# Patient Record
Sex: Male | Born: 1960 | Race: White | Hispanic: No | Smoking: Former smoker
Health system: Southern US, Community
[De-identification: ages and names within clinical notes are randomized; demographics above are authoritative.]

## PROBLEM LIST (undated history)

## (undated) ENCOUNTER — Emergency Department (HOSPITAL_COMMUNITY): Payer: Medicare HMO | Source: Home / Self Care

## (undated) DIAGNOSIS — G4700 Insomnia, unspecified: Secondary | ICD-10-CM

## (undated) DIAGNOSIS — J449 Chronic obstructive pulmonary disease, unspecified: Secondary | ICD-10-CM

## (undated) DIAGNOSIS — G629 Polyneuropathy, unspecified: Secondary | ICD-10-CM

## (undated) DIAGNOSIS — E785 Hyperlipidemia, unspecified: Secondary | ICD-10-CM

## (undated) DIAGNOSIS — F411 Generalized anxiety disorder: Secondary | ICD-10-CM

## (undated) HISTORY — DX: Hyperlipidemia, unspecified: E78.5

## (undated) HISTORY — DX: Chronic obstructive pulmonary disease, unspecified: J44.9

## (undated) HISTORY — DX: Polyneuropathy, unspecified: G62.9

## (undated) HISTORY — DX: Insomnia, unspecified: G47.00

## (undated) HISTORY — DX: Generalized anxiety disorder: F41.1

---

## 1994-07-31 HISTORY — PX: JOINT REPLACEMENT: SHX530

## 2015-05-21 DIAGNOSIS — H538 Other visual disturbances: Secondary | ICD-10-CM | POA: Diagnosis not present

## 2015-05-21 DIAGNOSIS — M79604 Pain in right leg: Secondary | ICD-10-CM | POA: Diagnosis not present

## 2015-05-21 DIAGNOSIS — G629 Polyneuropathy, unspecified: Secondary | ICD-10-CM | POA: Diagnosis not present

## 2015-05-21 DIAGNOSIS — M79605 Pain in left leg: Secondary | ICD-10-CM | POA: Diagnosis not present

## 2015-05-26 DIAGNOSIS — R209 Unspecified disturbances of skin sensation: Secondary | ICD-10-CM | POA: Diagnosis not present

## 2015-05-26 DIAGNOSIS — I739 Peripheral vascular disease, unspecified: Secondary | ICD-10-CM | POA: Diagnosis not present

## 2015-05-26 DIAGNOSIS — R938 Abnormal findings on diagnostic imaging of other specified body structures: Secondary | ICD-10-CM | POA: Diagnosis not present

## 2015-05-26 DIAGNOSIS — I70213 Atherosclerosis of native arteries of extremities with intermittent claudication, bilateral legs: Secondary | ICD-10-CM | POA: Diagnosis not present

## 2015-06-21 DIAGNOSIS — E782 Mixed hyperlipidemia: Secondary | ICD-10-CM | POA: Diagnosis not present

## 2015-06-21 DIAGNOSIS — M79605 Pain in left leg: Secondary | ICD-10-CM | POA: Diagnosis not present

## 2015-06-21 DIAGNOSIS — G629 Polyneuropathy, unspecified: Secondary | ICD-10-CM | POA: Diagnosis not present

## 2015-06-21 DIAGNOSIS — H538 Other visual disturbances: Secondary | ICD-10-CM | POA: Diagnosis not present

## 2015-06-21 DIAGNOSIS — G4709 Other insomnia: Secondary | ICD-10-CM | POA: Diagnosis not present

## 2015-06-21 DIAGNOSIS — R69 Illness, unspecified: Secondary | ICD-10-CM | POA: Diagnosis not present

## 2015-06-21 DIAGNOSIS — M79604 Pain in right leg: Secondary | ICD-10-CM | POA: Diagnosis not present

## 2015-06-23 ENCOUNTER — Encounter: Payer: Self-pay | Admitting: Vascular Surgery

## 2015-07-08 ENCOUNTER — Encounter: Payer: Self-pay | Admitting: Vascular Surgery

## 2015-07-14 ENCOUNTER — Encounter: Payer: Medicare HMO | Admitting: Vascular Surgery

## 2015-07-29 ENCOUNTER — Encounter: Payer: Self-pay | Admitting: Vascular Surgery

## 2015-08-06 ENCOUNTER — Encounter: Payer: Self-pay | Admitting: Vascular Surgery

## 2015-08-06 ENCOUNTER — Encounter: Payer: Medicare HMO | Admitting: Vascular Surgery

## 2015-08-13 ENCOUNTER — Encounter: Payer: Medicare HMO | Admitting: Vascular Surgery

## 2015-11-19 ENCOUNTER — Emergency Department (HOSPITAL_COMMUNITY)
Admission: EM | Admit: 2015-11-19 | Discharge: 2015-11-19 | Disposition: A | Discharge status: Home / Self Care | Payer: Medicare HMO | Attending: Emergency Medicine | Admitting: Emergency Medicine

## 2015-11-19 ENCOUNTER — Encounter (HOSPITAL_COMMUNITY): Payer: Self-pay | Admitting: Family Medicine

## 2015-11-19 ENCOUNTER — Emergency Department (HOSPITAL_COMMUNITY): Payer: Medicare HMO

## 2015-11-19 DIAGNOSIS — R69 Illness, unspecified: Secondary | ICD-10-CM | POA: Diagnosis not present

## 2015-11-19 DIAGNOSIS — Z8669 Personal history of other diseases of the nervous system and sense organs: Secondary | ICD-10-CM | POA: Insufficient documentation

## 2015-11-19 DIAGNOSIS — R062 Wheezing: Secondary | ICD-10-CM | POA: Insufficient documentation

## 2015-11-19 DIAGNOSIS — F17200 Nicotine dependence, unspecified, uncomplicated: Secondary | ICD-10-CM | POA: Diagnosis not present

## 2015-11-19 DIAGNOSIS — R0602 Shortness of breath: Secondary | ICD-10-CM | POA: Diagnosis not present

## 2015-11-19 DIAGNOSIS — R079 Chest pain, unspecified: Secondary | ICD-10-CM | POA: Diagnosis not present

## 2015-11-19 DIAGNOSIS — F411 Generalized anxiety disorder: Secondary | ICD-10-CM | POA: Diagnosis not present

## 2015-11-19 DIAGNOSIS — Z79899 Other long term (current) drug therapy: Secondary | ICD-10-CM | POA: Diagnosis not present

## 2015-11-19 DIAGNOSIS — R059 Cough, unspecified: Secondary | ICD-10-CM

## 2015-11-19 DIAGNOSIS — R05 Cough: Secondary | ICD-10-CM | POA: Diagnosis not present

## 2015-11-19 LAB — CBC
HCT: 38.7 % — ABNORMAL LOW (ref 39.0–52.0)
HEMOGLOBIN: 13.3 g/dL (ref 13.0–17.0)
MCH: 29.6 pg (ref 26.0–34.0)
MCHC: 34.4 g/dL (ref 30.0–36.0)
MCV: 86 fL (ref 78.0–100.0)
PLATELETS: 403 10*3/uL — AB (ref 150–400)
RBC: 4.5 MIL/uL (ref 4.22–5.81)
RDW: 13.3 % (ref 11.5–15.5)
WBC: 17.2 10*3/uL — ABNORMAL HIGH (ref 4.0–10.5)

## 2015-11-19 LAB — BASIC METABOLIC PANEL
ANION GAP: 13 (ref 5–15)
BUN: 15 mg/dL (ref 6–20)
CALCIUM: 8.6 mg/dL — AB (ref 8.9–10.3)
CO2: 22 mmol/L (ref 22–32)
Chloride: 99 mmol/L — ABNORMAL LOW (ref 101–111)
Creatinine, Ser: 1.22 mg/dL (ref 0.61–1.24)
GFR calc Af Amer: >60 mL/min (ref >60)
GLUCOSE: 110 mg/dL — AB (ref 65–99)
Potassium: 3.4 mmol/L — ABNORMAL LOW (ref 3.5–5.1)
Sodium: 134 mmol/L — ABNORMAL LOW (ref 135–145)

## 2015-11-19 LAB — I-STAT TROPONIN, ED: TROPONIN I, POC: 0 ng/mL (ref 0.00–0.08)

## 2015-11-19 LAB — D-DIMER, QUANTITATIVE: D-Dimer, Quant: 0.58 ug/mL-FEU — ABNORMAL HIGH (ref 0.00–0.50)

## 2015-11-19 MED ORDER — HYDROCODONE-HOMATROPINE 5-1.5 MG/5ML PO SYRP
5.0000 mL | ORAL_SOLUTION | Freq: Once | ORAL | Status: AC
Start: 2015-11-19 — End: 2015-11-19
  Administered 2015-11-19: 5 mL via ORAL
  Filled 2015-11-19: qty 5

## 2015-11-19 MED ORDER — IPRATROPIUM-ALBUTEROL 0.5-2.5 (3) MG/3ML IN SOLN
3.0000 mL | Freq: Once | RESPIRATORY_TRACT | Status: AC
  Administered 2015-11-19: 3 mL via RESPIRATORY_TRACT
  Filled 2015-11-19: qty 3

## 2015-11-19 MED ORDER — ALBUTEROL SULFATE HFA 108 (90 BASE) MCG/ACT IN AERS
2.0000 | INHALATION_SPRAY | RESPIRATORY_TRACT | Status: DC | PRN
  Administered 2015-11-19: 2 via RESPIRATORY_TRACT
  Filled 2015-11-19: qty 6.7

## 2015-11-19 MED ORDER — IOPAMIDOL (ISOVUE-370) INJECTION 76%
INTRAVENOUS | Status: AC
  Administered 2015-11-19: 100 mL
  Filled 2015-11-19: qty 100

## 2015-11-19 MED ORDER — HYDROCODONE-HOMATROPINE 5-1.5 MG/5ML PO SYRP: 5.0000 mL | ORAL_SOLUTION | Freq: Four times a day (QID) | ORAL | Status: DC | PRN

## 2015-11-19 MED ORDER — AMOXICILLIN-POT CLAVULANATE 875-125 MG PO TABS: 1.0000 | ORAL_TABLET | Freq: Two times a day (BID) | ORAL | Status: DC

## 2015-11-19 MED ORDER — ONDANSETRON HCL 4 MG/2ML IJ SOLN
4.0000 mg | Freq: Once | INTRAMUSCULAR | Status: AC
  Administered 2015-11-19: 4 mg via INTRAVENOUS
  Filled 2015-11-19: qty 2

## 2015-11-19 NOTE — ED Provider Notes (Signed)
Patient was seen and evaluate by Roxy Horsemanobert Browning, PA-C. Patient has chest pain, shortness of breath, productive cough, and a leukocytosis as well as an elevated d-dimer. A chest CTA was performed showing no evidence of PE however he does have finding consistence of suggestive of pneumonia. Significance signs of emphysema was noted as well. Patient is a smoker. Discussed smoking cessation. Patient will be discharged with Augmentin and will follow-up closely with PCP for a repeat x-ray for further care.  Patient reporting improvement of his shortness of breath after receiving breathing treatment. We'll ambulate patient and check O2 status prior to discharge.  5:43 PM Able to ambulate with O2 >90%.  Able to carry a conversation while ambulating.  He is stable for d/c.  Return precaution given.    BP 117/67 mmHg  Pulse 102  Temp(Src) 99.1 F (37.3 C) (Oral)  Resp 18  Ht 5\' 10"  (1.778 m)  Wt 74.844 kg  BMI 23.68 kg/m2  SpO2 92%  Results for orders placed or performed during the hospital encounter of 11/19/15  Basic metabolic panel  Result Value Ref Range   Sodium 134 (L) 135 - 145 mmol/L   Potassium 3.4 (L) 3.5 - 5.1 mmol/L   Chloride 99 (L) 101 - 111 mmol/L   CO2 22 22 - 32 mmol/L   Glucose, Bld 110 (H) 65 - 99 mg/dL   BUN 15 6 - 20 mg/dL   Creatinine, Ser 1.611.22 0.61 - 1.24 mg/dL   Calcium 8.6 (L) 8.9 - 10.3 mg/dL   GFR calc non Af Amer >60 >60 mL/min   GFR calc Af Amer >60 >60 mL/min   Anion gap 13 5 - 15  CBC  Result Value Ref Range   WBC 17.2 (H) 4.0 - 10.5 K/uL   RBC 4.50 4.22 - 5.81 MIL/uL   Hemoglobin 13.3 13.0 - 17.0 g/dL   HCT 09.638.7 (L) 04.539.0 - 40.952.0 %   MCV 86.0 78.0 - 100.0 fL   MCH 29.6 26.0 - 34.0 pg   MCHC 34.4 30.0 - 36.0 g/dL   RDW 81.113.3 91.411.5 - 78.215.5 %   Platelets 403 (H) 150 - 400 K/uL  D-dimer, quantitative (not [facility])  Result Value Ref Range   D-Dimer, Quant 0.58 (H) 0.00 - 0.50 ug/mL-FEU  I-stat troponin, ED  Result Value Ref Range   Troponin i, poc 0.00 0.00  - 0.08 ng/mL   Comment 3           Dg Chest 2 View  11/19/2015  CLINICAL DATA:  Cough.  Chest pain for 1 week. EXAM: CHEST  2 VIEW COMPARISON:  COMPARISON 07/06/2011 and 05/27/2008 FINDINGS: Mild interstitial accentuation, as can commonly be encountered in smokers. Mild bilateral airway thickening with large lung volumes and underlying emphysema. Cardiac and mediastinal margins appear normal. IMPRESSION: 1. Emphysema. 2. Mild interstitial accentuation, as can commonly be encountered in smokers. Electronically Signed   By: Gaylyn RongWalter  Liebkemann M.D.   On: 11/19/2015 13:44   Ct Angio Chest Pe W/cm &/or Wo Cm  11/19/2015  CLINICAL DATA:  Mid and right-sided chest pain. EXAM: CT ANGIOGRAPHY CHEST WITH CONTRAST TECHNIQUE: Multidetector CT imaging of the chest was performed using the standard protocol during bolus administration of intravenous contrast. Multiplanar CT image reconstructions and MIPs were obtained to evaluate the vascular anatomy. CONTRAST:  75 mL Isovue 370 IV COMPARISON:  Chest x-ray earlier today as well as prior CT of the chest without contrast on 05/27/2008 FINDINGS: Mediastinum/Nodes: The pulmonary arteries are adequately opacified.  There is no evidence of pulmonary embolism. The thoracic aorta is normal in caliber. Mildly prominent AP window and prevascular lymph nodes identified. AP window node measures approximately 1.1 cm. Right hilar lymph node tissue prominence present with short axis of approximately 18 mm. Mildly prominent left hilar lymph nodes. Lungs/Pleura: There is evidence of moderately advanced emphysematous lung disease. Lower lobes demonstrate patchy areas of reticulonodular disease throughout with areas of bronchial thickening as well as patchy parenchymal nodularity. This includes a nodular area at the posterior right lung base measuring approximately 0.9 x 1.5 cm. Less prominent reticulonodular opacities are also present in the upper lobes and lingula. Upper abdomen: Small  gastrohepatic lymph node measures approximately 1.0 cm. Musculoskeletal: Mild spondylosis present of the thoracic spine. Review of the MIP images confirms the above findings. IMPRESSION: 1. No evidence of pulmonary embolism. 2. Advanced emphysematous lung disease. Prominent reticulonodular disease is predominantly in the lower lobes bilaterally. This appears new since 2009. Although this may be secondary to chronic disease, there may be a component of pneumonia bilaterally. 3. Mildly prominent lymph nodes in the mediastinum and right hilum are felt to most likely be reactive on the basis of the significant lung disease present. Electronically Signed   By: Irish Lack M.D.   On: 11/19/2015 16:40      Fayrene Helper, PA-C 11/19/15 1743  Loren Racer, MD 11/20/15 [phone]

## 2015-11-19 NOTE — ED Notes (Signed)
Pt requesting something to drink due to mouth dryness, given a mouth swab by this RN.

## 2015-11-19 NOTE — Discharge Instructions (Signed)

## 2015-11-19 NOTE — ED Provider Notes (Signed)
CSN: [id number]     Arrival date & time 11/19/15  1251 History   First MD Initiated Contact with Patient 11/19/15 1257     Chief Complaint  Patient presents with  . Chest Pain  . Shortness of Breath     (Consider location/radiation/quality/duration/timing/severity/associated sxs/prior Treatment) HPI Comments: Patient presents emergency department with chief complaint of chest pain and shortness breath. States that he has been having symptoms for the past 2 days. He states that his chest pain is worsened when he takes deep breath. He states that he does not have any heart or lung problems. No history of ACS, PE, or DVT. He states that he was around a friend, who had influenza. He questions whether he contracted this. He states that he did have some fevers and chills, but these resolved. He has not taken anything for his symptoms.  The history is provided by the patient. No language interpreter was used.    Past Medical History  Diagnosis Date  . Generalized anxiety disorder   . Insomnia   . Polyneuropathy [facility](HCC)    Past Surgical History  Procedure Laterality Date  . Joint replacement  1996    Knee   History reviewed. No pertinent family history. Social History  Substance Use Topics  . Smoking status: Current Every Day Smoker -- 1.00 packs/day  . Smokeless tobacco: Never Used  . Alcohol Use: No     Comment: "very seldom"    Review of Systems  Constitutional: Negative for fever and chills.  Respiratory: Positive for cough and shortness of breath.   Cardiovascular: Positive for chest pain.  Gastrointestinal: Negative for nausea, vomiting, diarrhea and constipation.  Genitourinary: Negative for dysuria.  All other systems reviewed and are negative.     Allergies  Review of patient's allergies indicates no known allergies.  Home Medications   Prior to Admission medications   Medication Sig Start Date End Date Taking? Authorizing Provider  ALPRAZolam Prudy Feeler(XANAX) 1 MG tablet  Take 1 mg by mouth at bedtime as needed for anxiety.    Historical Provider, MD  cyclobenzaprine (FLEXERIL) 10 MG tablet Take 10 mg by mouth 3 (three) times daily as needed for muscle spasms.    Historical Provider, MD  HYDROcodone-acetaminophen (NORCO/VICODIN) 5-325 MG tablet Take 1 tablet by mouth every 6 (six) hours as needed for moderate pain.    Historical Provider, MD  promethazine (PHENERGAN) 25 MG tablet Take 25 mg by mouth every 6 (six) hours as needed for nausea or vomiting.    Historical Provider, MD   BP 120/78 mmHg  Pulse 102  Temp(Src) 99.1 F (37.3 C) (Oral)  Resp 17  Ht 5\' 10"  (1.778 m)  Wt 74.844 kg  BMI 23.68 kg/m2  SpO2 94% Physical Exam  Constitutional: He is oriented to person, place, and time. He appears well-developed and well-nourished.  HENT:  Head: Normocephalic and atraumatic.  Eyes: Conjunctivae and EOM are normal. Pupils are equal, round, and reactive to light. Right eye exhibits no discharge. Left eye exhibits no discharge. No scleral icterus.  Neck: Normal range of motion. Neck supple. No JVD present.  Cardiovascular: Normal rate, regular rhythm and normal heart sounds.  Exam reveals no gallop and no friction rub.   No murmur heard. Pulmonary/Chest: Effort normal. No respiratory distress. He has wheezes. He has no rales. He exhibits no tenderness.  Mild right upper lobe wheeze  Abdominal: Soft. He exhibits no distension and no mass. There is no tenderness. There is no rebound and no  guarding.  Musculoskeletal: Normal range of motion. He exhibits no edema or tenderness.  Neurological: He is alert and oriented to person, place, and time.  Skin: Skin is warm and dry.  Psychiatric: He has a normal mood and affect. His behavior is normal. Judgment and thought content normal.  Nursing note and vitals reviewed.   ED Course  Procedures (including critical care time) Labs Review Labs Reviewed  BASIC METABOLIC PANEL  CBC  D-DIMER, QUANTITATIVE (NOT AT [facility])   I-STAT TROPOININ, ED    Imaging Review No results found. I have personally reviewed and evaluated these images and lab results as part of my medical decision-making.   EKG Interpretation None      MDM   Final diagnoses:  None   Patient with chest pain and shortness of breath. Chest pain is worse with deep breathing. No history of ACS or PE, however patient is mildly tachycardic, and oxygen saturations a little low at 94%. Will check d-dimer. Also check chest x-ray, basic labs. Patient has had recent influenza exposure. Is possible that he has intercostal strain from coughing and body aches from influence. Will treat with Hycodan and will give a breathing treatment, he had a mild right upper lobe wheeze.  2:04 PM Patient feels improved significantly with breathing treatment. He has had a productive cough, concern for possible pneumonia not shown on chest x-ray. Plan for treatment with Augmentin.  D-dimer is mildly elevated.  Will check CTA chest to rule out PE.  Patient signed out to [place], PA-C at shift change.  Plan:  Follow-up on CTA.  DC if negative.    Roxy Horseman, PA-C 11/19/15 1543  Melene Plan, DO 11/19/15 1544

## 2015-11-19 NOTE — ED Notes (Signed)
Pt presents from work [facility] [employer] with c/o right-sided chest pain and shortness of breath, pain worse on inspiration.  Pt has not been feeling well for a week - reports cough and malaise/general body aches.  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  Pt is A&Ox4 and appears flushed, but is not diaphoretic.

## 2015-11-21 DIAGNOSIS — J440 Chronic obstructive pulmonary disease with acute lower respiratory infection: Secondary | ICD-10-CM | POA: Diagnosis not present

## 2015-11-21 DIAGNOSIS — E876 Hypokalemia: Secondary | ICD-10-CM | POA: Diagnosis not present

## 2015-11-21 DIAGNOSIS — J189 Pneumonia, unspecified organism: Secondary | ICD-10-CM | POA: Diagnosis not present

## 2015-11-21 DIAGNOSIS — J439 Emphysema, unspecified: Secondary | ICD-10-CM | POA: Diagnosis not present

## 2015-11-21 DIAGNOSIS — D649 Anemia, unspecified: Secondary | ICD-10-CM | POA: Diagnosis not present

## 2015-11-21 DIAGNOSIS — J441 Chronic obstructive pulmonary disease with (acute) exacerbation: Secondary | ICD-10-CM | POA: Diagnosis not present

## 2015-11-21 DIAGNOSIS — R69 Illness, unspecified: Secondary | ICD-10-CM | POA: Diagnosis not present

## 2015-11-21 DIAGNOSIS — J9601 Acute respiratory failure with hypoxia: Secondary | ICD-10-CM | POA: Diagnosis not present

## 2015-11-21 DIAGNOSIS — E873 Alkalosis: Secondary | ICD-10-CM | POA: Diagnosis not present

## 2015-11-21 DIAGNOSIS — J209 Acute bronchitis, unspecified: Secondary | ICD-10-CM | POA: Diagnosis not present

## 2015-11-21 DIAGNOSIS — R0602 Shortness of breath: Secondary | ICD-10-CM | POA: Diagnosis not present

## 2015-12-15 DIAGNOSIS — M79604 Pain in right leg: Secondary | ICD-10-CM | POA: Diagnosis not present

## 2015-12-15 DIAGNOSIS — M79605 Pain in left leg: Secondary | ICD-10-CM | POA: Diagnosis not present

## 2015-12-15 DIAGNOSIS — G4709 Other insomnia: Secondary | ICD-10-CM | POA: Diagnosis not present

## 2015-12-15 DIAGNOSIS — R69 Illness, unspecified: Secondary | ICD-10-CM | POA: Diagnosis not present

## 2015-12-15 DIAGNOSIS — G629 Polyneuropathy, unspecified: Secondary | ICD-10-CM | POA: Diagnosis not present

## 2015-12-24 DIAGNOSIS — R69 Illness, unspecified: Secondary | ICD-10-CM | POA: Diagnosis not present

## 2015-12-24 DIAGNOSIS — J189 Pneumonia, unspecified organism: Secondary | ICD-10-CM | POA: Diagnosis not present

## 2015-12-24 DIAGNOSIS — G629 Polyneuropathy, unspecified: Secondary | ICD-10-CM | POA: Diagnosis not present

## 2015-12-24 DIAGNOSIS — M79604 Pain in right leg: Secondary | ICD-10-CM | POA: Diagnosis not present

## 2015-12-24 DIAGNOSIS — H538 Other visual disturbances: Secondary | ICD-10-CM | POA: Diagnosis not present

## 2015-12-24 DIAGNOSIS — M79605 Pain in left leg: Secondary | ICD-10-CM | POA: Diagnosis not present

## 2015-12-24 DIAGNOSIS — G4709 Other insomnia: Secondary | ICD-10-CM | POA: Diagnosis not present

## 2016-02-15 DIAGNOSIS — E876 Hypokalemia: Secondary | ICD-10-CM | POA: Diagnosis not present

## 2016-02-15 DIAGNOSIS — I10 Essential (primary) hypertension: Secondary | ICD-10-CM | POA: Diagnosis not present

## 2016-02-15 DIAGNOSIS — Z6822 Body mass index (BMI) 22.0-22.9, adult: Secondary | ICD-10-CM | POA: Diagnosis not present

## 2016-02-15 DIAGNOSIS — G629 Polyneuropathy, unspecified: Secondary | ICD-10-CM | POA: Diagnosis not present

## 2016-06-13 DIAGNOSIS — R42 Dizziness and giddiness: Secondary | ICD-10-CM | POA: Diagnosis not present

## 2016-06-13 DIAGNOSIS — S0081XA Abrasion of other part of head, initial encounter: Secondary | ICD-10-CM | POA: Diagnosis not present

## 2016-06-13 DIAGNOSIS — M25512 Pain in left shoulder: Secondary | ICD-10-CM | POA: Diagnosis not present

## 2016-06-13 DIAGNOSIS — Z7982 Long term (current) use of aspirin: Secondary | ICD-10-CM | POA: Diagnosis not present

## 2016-06-13 DIAGNOSIS — Z87891 Personal history of nicotine dependence: Secondary | ICD-10-CM | POA: Diagnosis not present

## 2016-06-13 DIAGNOSIS — S43402A Unspecified sprain of left shoulder joint, initial encounter: Secondary | ICD-10-CM | POA: Diagnosis not present

## 2016-06-13 DIAGNOSIS — R269 Unspecified abnormalities of gait and mobility: Secondary | ICD-10-CM | POA: Diagnosis not present

## 2016-06-13 DIAGNOSIS — Z23 Encounter for immunization: Secondary | ICD-10-CM | POA: Diagnosis not present

## 2016-06-16 DIAGNOSIS — E876 Hypokalemia: Secondary | ICD-10-CM | POA: Diagnosis not present

## 2016-06-16 DIAGNOSIS — I10 Essential (primary) hypertension: Secondary | ICD-10-CM | POA: Diagnosis not present

## 2016-06-16 DIAGNOSIS — G629 Polyneuropathy, unspecified: Secondary | ICD-10-CM | POA: Diagnosis not present

## 2016-06-16 DIAGNOSIS — Z6823 Body mass index (BMI) 23.0-23.9, adult: Secondary | ICD-10-CM | POA: Diagnosis not present

## 2016-06-16 DIAGNOSIS — S43492A Other sprain of left shoulder joint, initial encounter: Secondary | ICD-10-CM | POA: Diagnosis not present

## 2016-07-31 DIAGNOSIS — J189 Pneumonia, unspecified organism: Secondary | ICD-10-CM

## 2016-07-31 HISTORY — DX: Pneumonia, unspecified organism: J18.9

## 2016-09-14 DIAGNOSIS — S43492A Other sprain of left shoulder joint, initial encounter: Secondary | ICD-10-CM | POA: Diagnosis not present

## 2016-09-14 DIAGNOSIS — E876 Hypokalemia: Secondary | ICD-10-CM | POA: Diagnosis not present

## 2016-09-14 DIAGNOSIS — Z6823 Body mass index (BMI) 23.0-23.9, adult: Secondary | ICD-10-CM | POA: Diagnosis not present

## 2016-09-14 DIAGNOSIS — R69 Illness, unspecified: Secondary | ICD-10-CM | POA: Diagnosis not present

## 2016-09-14 DIAGNOSIS — G4709 Other insomnia: Secondary | ICD-10-CM | POA: Diagnosis not present

## 2016-09-14 DIAGNOSIS — Z1389 Encounter for screening for other disorder: Secondary | ICD-10-CM | POA: Diagnosis not present

## 2016-09-14 DIAGNOSIS — G629 Polyneuropathy, unspecified: Secondary | ICD-10-CM | POA: Diagnosis not present

## 2016-09-14 DIAGNOSIS — I10 Essential (primary) hypertension: Secondary | ICD-10-CM | POA: Diagnosis not present

## 2017-03-13 DIAGNOSIS — R69 Illness, unspecified: Secondary | ICD-10-CM | POA: Diagnosis not present

## 2017-03-13 DIAGNOSIS — M1711 Unilateral primary osteoarthritis, right knee: Secondary | ICD-10-CM | POA: Diagnosis not present

## 2017-03-13 DIAGNOSIS — G4709 Other insomnia: Secondary | ICD-10-CM | POA: Diagnosis not present

## 2017-03-13 DIAGNOSIS — G629 Polyneuropathy, unspecified: Secondary | ICD-10-CM | POA: Diagnosis not present

## 2017-03-13 DIAGNOSIS — M542 Cervicalgia: Secondary | ICD-10-CM | POA: Diagnosis not present

## 2017-03-13 DIAGNOSIS — E876 Hypokalemia: Secondary | ICD-10-CM | POA: Diagnosis not present

## 2017-03-13 DIAGNOSIS — I10 Essential (primary) hypertension: Secondary | ICD-10-CM | POA: Diagnosis not present

## 2017-03-13 DIAGNOSIS — Z6823 Body mass index (BMI) 23.0-23.9, adult: Secondary | ICD-10-CM | POA: Diagnosis not present

## 2017-03-13 DIAGNOSIS — Z1389 Encounter for screening for other disorder: Secondary | ICD-10-CM | POA: Diagnosis not present

## 2017-05-10 DIAGNOSIS — Z8261 Family history of arthritis: Secondary | ICD-10-CM | POA: Diagnosis not present

## 2017-05-10 DIAGNOSIS — M1990 Unspecified osteoarthritis, unspecified site: Secondary | ICD-10-CM | POA: Diagnosis not present

## 2017-05-10 DIAGNOSIS — Z886 Allergy status to analgesic agent status: Secondary | ICD-10-CM | POA: Diagnosis not present

## 2017-05-10 DIAGNOSIS — Z8249 Family history of ischemic heart disease and other diseases of the circulatory system: Secondary | ICD-10-CM | POA: Diagnosis not present

## 2017-05-10 DIAGNOSIS — K6289 Other specified diseases of anus and rectum: Secondary | ICD-10-CM | POA: Diagnosis not present

## 2017-05-10 DIAGNOSIS — Z79899 Other long term (current) drug therapy: Secondary | ICD-10-CM | POA: Diagnosis not present

## 2017-05-10 DIAGNOSIS — Z87891 Personal history of nicotine dependence: Secondary | ICD-10-CM | POA: Diagnosis not present

## 2017-05-10 DIAGNOSIS — Z7982 Long term (current) use of aspirin: Secondary | ICD-10-CM | POA: Diagnosis not present

## 2017-05-10 DIAGNOSIS — R69 Illness, unspecified: Secondary | ICD-10-CM | POA: Diagnosis not present

## 2017-05-10 DIAGNOSIS — Z1211 Encounter for screening for malignant neoplasm of colon: Secondary | ICD-10-CM | POA: Diagnosis not present

## 2017-08-10 DIAGNOSIS — G629 Polyneuropathy, unspecified: Secondary | ICD-10-CM | POA: Diagnosis not present

## 2017-08-10 DIAGNOSIS — M542 Cervicalgia: Secondary | ICD-10-CM | POA: Diagnosis not present

## 2017-08-10 DIAGNOSIS — H538 Other visual disturbances: Secondary | ICD-10-CM | POA: Diagnosis not present

## 2017-08-10 DIAGNOSIS — I10 Essential (primary) hypertension: Secondary | ICD-10-CM | POA: Diagnosis not present

## 2017-08-10 DIAGNOSIS — M1711 Unilateral primary osteoarthritis, right knee: Secondary | ICD-10-CM | POA: Diagnosis not present

## 2017-08-10 DIAGNOSIS — E876 Hypokalemia: Secondary | ICD-10-CM | POA: Diagnosis not present

## 2017-08-10 DIAGNOSIS — M79604 Pain in right leg: Secondary | ICD-10-CM | POA: Diagnosis not present

## 2017-08-10 DIAGNOSIS — Z6823 Body mass index (BMI) 23.0-23.9, adult: Secondary | ICD-10-CM | POA: Diagnosis not present

## 2017-09-06 DIAGNOSIS — G43B0 Ophthalmoplegic migraine, not intractable: Secondary | ICD-10-CM | POA: Diagnosis not present

## 2017-09-06 DIAGNOSIS — H2513 Age-related nuclear cataract, bilateral: Secondary | ICD-10-CM | POA: Diagnosis not present

## 2017-09-06 DIAGNOSIS — H52223 Regular astigmatism, bilateral: Secondary | ICD-10-CM | POA: Diagnosis not present

## 2017-09-06 DIAGNOSIS — H524 Presbyopia: Secondary | ICD-10-CM | POA: Diagnosis not present

## 2017-09-06 DIAGNOSIS — H5203 Hypermetropia, bilateral: Secondary | ICD-10-CM | POA: Diagnosis not present

## 2017-09-06 DIAGNOSIS — R51 Headache: Secondary | ICD-10-CM | POA: Diagnosis not present

## 2017-11-29 DIAGNOSIS — M25552 Pain in left hip: Secondary | ICD-10-CM | POA: Diagnosis not present

## 2017-11-29 DIAGNOSIS — R634 Abnormal weight loss: Secondary | ICD-10-CM | POA: Diagnosis not present

## 2017-11-29 DIAGNOSIS — Z6822 Body mass index (BMI) 22.0-22.9, adult: Secondary | ICD-10-CM | POA: Diagnosis not present

## 2018-03-08 DIAGNOSIS — I10 Essential (primary) hypertension: Secondary | ICD-10-CM | POA: Diagnosis not present

## 2018-03-08 DIAGNOSIS — M542 Cervicalgia: Secondary | ICD-10-CM | POA: Diagnosis not present

## 2018-03-08 DIAGNOSIS — Z1389 Encounter for screening for other disorder: Secondary | ICD-10-CM | POA: Diagnosis not present

## 2018-03-08 DIAGNOSIS — Z6822 Body mass index (BMI) 22.0-22.9, adult: Secondary | ICD-10-CM | POA: Diagnosis not present

## 2018-03-08 DIAGNOSIS — H538 Other visual disturbances: Secondary | ICD-10-CM | POA: Diagnosis not present

## 2018-03-08 DIAGNOSIS — G629 Polyneuropathy, unspecified: Secondary | ICD-10-CM | POA: Diagnosis not present

## 2018-03-08 DIAGNOSIS — K047 Periapical abscess without sinus: Secondary | ICD-10-CM | POA: Diagnosis not present

## 2018-03-08 DIAGNOSIS — Z1331 Encounter for screening for depression: Secondary | ICD-10-CM | POA: Diagnosis not present

## 2018-07-15 DIAGNOSIS — R69 Illness, unspecified: Secondary | ICD-10-CM | POA: Diagnosis not present

## 2020-02-12 ENCOUNTER — Other Ambulatory Visit: Payer: Self-pay | Admitting: Family Medicine

## 2020-02-12 DIAGNOSIS — M545 Low back pain, unspecified: Secondary | ICD-10-CM

## 2020-02-12 DIAGNOSIS — G8929 Other chronic pain: Secondary | ICD-10-CM

## 2020-10-11 ENCOUNTER — Institutional Professional Consult (permissible substitution) (INDEPENDENT_AMBULATORY_CARE_PROVIDER_SITE_OTHER): Payer: Medicare HMO | Admitting: Thoracic Surgery (Cardiothoracic Vascular Surgery)

## 2020-10-11 ENCOUNTER — Other Ambulatory Visit: Payer: Self-pay | Admitting: Thoracic Surgery (Cardiothoracic Vascular Surgery)

## 2020-10-11 ENCOUNTER — Encounter: Payer: Self-pay | Admitting: Thoracic Surgery (Cardiothoracic Vascular Surgery)

## 2020-10-11 ENCOUNTER — Other Ambulatory Visit: Payer: Self-pay

## 2020-10-11 VITALS — BP 151/79 | HR 82 | Resp 20 | Ht 70.0 in | Wt 164.8 lb

## 2020-10-11 DIAGNOSIS — R911 Solitary pulmonary nodule: Secondary | ICD-10-CM

## 2020-10-11 NOTE — Progress Notes (Signed)
PCP is Lianne Moris, PA-C Referring Provider is Selinda Flavin, MD  Chief Complaint  Patient presents with  . Lung Lesion    New lung nodule, review CT scan from 09/22/20    HPI: Steven Salinas is sent for consultation regarding a left upper lobe lung nodule  Steven Salinas is a 60 year old man with a history of tobacco abuse, COPD, anxiety, hyperlipidemia, polyneuropathy, and chronic neck and back pain.  He smoked about a pack and 1/2 to 2 packs of cigarettes daily for 38 years prior to quitting 5 years ago.  He recently had a low-dose screening CT which showed a new left upper lobe spiculated nodule that was not present 6 months ago.  He quit smoking 5 years ago after a bout of pneumococcal pneumonia.  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  Other than his pain issues he has been feeling reasonably well.  He says he might have had a fever a couple days ago but that was a one-time issue.  He denies any headaches or visual changes.  He has not had any change in appetite or weight loss.  He does get short of breath with exertion.  His physical activities are significantly limited by his neuropathy much more so than any shortness of breath.  Zubrod Score: At the time of surgery this patient's most appropriate activity status/level should be described as: []     0    Normal activity, no symptoms []     1    Restricted in physical strenuous activity but ambulatory, able to do out light work [x]     2    Ambulatory and capable of self care, unable to do work activities, up and about >50 % of waking hours                              []     3    Only limited self care, in bed greater than 50% of waking hours []     4    Completely disabled, no self care, confined to bed or chair []     5    Moribund  Past Medical History:  Diagnosis Date  . COPD (chronic obstructive pulmonary disease) (HCC)   . Generalized anxiety disorder   . Hyperlipidemia   . Insomnia   .  Polyneuropathy     Past Surgical History:  Procedure Laterality Date  . JOINT REPLACEMENT  1996   Knee    Family History  Problem Relation Age of Onset  . Heart disease Mother   . Rheum arthritis Mother   . Heart disease Father     Social History Social History   Tobacco Use  . Smoking status: Former Smoker    Packs/day: 1.00    Quit date: 11/29/2015    Years since quitting: 4.8  . Smokeless tobacco: Never Used  Substance Use Topics  . Alcohol use: No    Comment: "very seldom"  . Drug use: Yes    Types: Marijuana    Comment: weekly    Current Outpatient Medications  Medication Sig Dispense Refill  . albuterol (VENTOLIN HFA) 108 (90 Base) MCG/ACT inhaler Inhale 2 puffs into the lungs every 6 (six) hours as needed for wheezing or shortness of breath.    . ALPRAZolam (XANAX) 1 MG tablet Take 1 mg by mouth at bedtime as needed for anxiety.    . ASPIRIN LOW DOSE  81 MG EC tablet Take 81 mg by mouth daily.    Marland Kitchen atorvastatin (LIPITOR) 10 MG tablet Take 10 mg by mouth daily.    Marland Kitchen buPROPion (WELLBUTRIN XL) 300 MG 24 hr tablet Take 300 mg by mouth daily.    . cyclobenzaprine (FLEXERIL) 10 MG tablet Take 10 mg by mouth 3 (three) times daily as needed for muscle spasms.    Marland Kitchen HYDROcodone-homatropine (HYCODAN) 5-1.5 MG/5ML syrup Take 5 mLs by mouth every 6 (six) hours as needed for cough. 120 mL 0  . promethazine (PHENERGAN) 25 MG tablet Take 25 mg by mouth every 6 (six) hours as needed for nausea or vomiting.    . SYMBICORT 160-4.5 MCG/ACT inhaler Inhale 2 puffs into the lungs 2 (two) times daily.     No current facility-administered medications for this visit.    Allergies  Allergen Reactions  . Codeine     Review of Systems  Constitutional: Positive for fever (see HPI) and unexpected weight change (Has gained 5 pounds in the past 3 months). Negative for activity change and appetite change.  Eyes: Negative for visual disturbance.  Respiratory: Positive for cough and shortness  of breath. Negative for wheezing.   Cardiovascular: Positive for chest pain.  Gastrointestinal: Positive for abdominal pain (Reflux after eating and at night).  Genitourinary: Negative for difficulty urinating and dysuria.  Musculoskeletal: Positive for arthralgias, back pain, gait problem, myalgias and neck pain.  Neurological: Positive for numbness. Negative for seizures and syncope.       Chronic pain  Hematological: Negative for adenopathy. Does not bruise/bleed easily.  Psychiatric/Behavioral: The patient is nervous/anxious.     BP (!) 151/79   Pulse 82   Resp 20   Ht 5\' 10"  (1.778 m)   Wt 164 lb 12.8 oz (74.8 kg)   SpO2 96% Comment: on RA  BMI 23.65 kg/m  Physical Exam Vitals reviewed.  Constitutional:      General: He is not in acute distress.    Comments: Anxious 60 year old man  HENT:     Head: Normocephalic and atraumatic.  Eyes:     General: No scleral icterus.    Extraocular Movements: Extraocular movements intact.  Neck:     Vascular: No carotid bruit.  Cardiovascular:     Rate and Rhythm: Normal rate and regular rhythm.     Heart sounds: Normal heart sounds. No murmur heard. No friction rub. No gallop.   Pulmonary:     Effort: Pulmonary effort is normal. No respiratory distress.     Breath sounds: Wheezing (faint at right base) present. No rales.  Abdominal:     General: There is no distension.     Palpations: Abdomen is soft.  Musculoskeletal:        General: No swelling.     Cervical back: Neck supple.  Lymphadenopathy:     Cervical: No cervical adenopathy.  Skin:    General: Skin is warm and dry.  Neurological:     General: No focal deficit present.     Mental Status: He is oriented to person, place, and time.     Cranial Nerves: No cranial nerve deficit.     Motor: No weakness.    Diagnostic Tests: I personally reviewed his chest CT images.  The images are available through the PACS system (back door from his CT angio in 2017).  There is a new  9 mm left upper lobe nodule.  That nodule is new from 6 months ago.  There are some  additional nodules that are stable.  There is evidence of coronary and aortic atherosclerosis.  There is significant emphysema.  Impression: Steven Salinas  is a 60 year old former smoker with a history of COPD, anxiety, hyperlipidemia, polyneuropathy, and chronic neck and back pain.  He has been followed in a low-dose lung cancer screening program.  His most recent scan from a couple of weeks ago shows a new 9 mm left upper lobe nodule near an area of significant emphysema.  There is a second nodule along the major fissure in the left upper lobe that also is a little worrisome although it smaller.  There are other nodules that are stable from his prior scans.  The Dr. Dorris Fetch differential diagnosis includes primary bronchogenic carcinoma, infectious (granulomas), and inflammatory nodules.  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  We discussed the differential diagnosis.  He understands that we do not know for sure that this is a cancer but we need to prove that it is not.  This nodule is not particularly favorable for biopsy.  Could be approached with navigational bronchoscopy but given its small size the yield would be relatively low.  The nodule is not amenable to percutaneous biopsy.    He needs a PET/CT to guide our initial diagnostic approach.  It does appear he would be a potential surgical candidate.  He needs pulmonary function testing with and without bronchodilators to better assess that.  Plan: 1. PET/CT-new lung nodule on screening CT, guide initial diagnostic work-up  2.  Pulmonary function testing with and without bronchodilators  3.  Return in 2 weeks  I spent over 30 minutes in review of images, records, and consultation with Steven Salinas today. Steven Slot, MD [facility] [phone]

## 2020-10-12 ENCOUNTER — Encounter: Payer: Self-pay | Admitting: *Deleted

## 2020-10-22 ENCOUNTER — Other Ambulatory Visit (HOSPITAL_COMMUNITY)
Admission: RE | Admit: 2020-10-22 | Discharge: 2020-10-22 | Disposition: A | Discharge status: Home / Self Care | Payer: Medicare HMO | Source: Ambulatory Visit | Attending: Thoracic Surgery (Cardiothoracic Vascular Surgery) | Admitting: Thoracic Surgery (Cardiothoracic Vascular Surgery)

## 2020-10-22 ENCOUNTER — Other Ambulatory Visit: Payer: Self-pay

## 2020-10-22 DIAGNOSIS — Z01812 Encounter for preprocedural laboratory examination: Secondary | ICD-10-CM | POA: Insufficient documentation

## 2020-10-22 DIAGNOSIS — Z20822 Contact with and (suspected) exposure to covid-19: Secondary | ICD-10-CM | POA: Insufficient documentation

## 2020-10-23 LAB — SARS CORONAVIRUS 2 (TAT 6-24 HRS): SARS Coronavirus 2: NEGATIVE

## 2020-10-26 ENCOUNTER — Ambulatory Visit (HOSPITAL_COMMUNITY): Admission: RE | Admit: 2020-10-26 | Payer: Medicare HMO | Source: Ambulatory Visit

## 2020-10-29 ENCOUNTER — Ambulatory Visit (HOSPITAL_COMMUNITY)
Admission: RE | Admit: 2020-10-29 | Discharge: 2020-10-29 | Disposition: A | Discharge status: Home / Self Care | Payer: Medicare HMO | Source: Ambulatory Visit | Attending: Thoracic Surgery (Cardiothoracic Vascular Surgery) | Admitting: Thoracic Surgery (Cardiothoracic Vascular Surgery)

## 2020-10-29 ENCOUNTER — Other Ambulatory Visit: Payer: Self-pay

## 2020-10-29 DIAGNOSIS — R911 Solitary pulmonary nodule: Secondary | ICD-10-CM | POA: Insufficient documentation

## 2020-10-29 LAB — GLUCOSE, CAPILLARY: Glucose-Capillary: 105 mg/dL — ABNORMAL HIGH (ref 70–99)

## 2020-10-29 MED ORDER — FLUDEOXYGLUCOSE F - 18 (FDG) INJECTION
7.2000 | Freq: Once | INTRAVENOUS | Status: AC | PRN
  Administered 2020-10-29: 8.1 via INTRAVENOUS

## 2020-11-01 ENCOUNTER — Other Ambulatory Visit: Payer: Self-pay

## 2020-11-01 ENCOUNTER — Ambulatory Visit (HOSPITAL_COMMUNITY)
Admission: RE | Admit: 2020-11-01 | Discharge: 2020-11-01 | Disposition: A | Discharge status: Home / Self Care | Payer: Medicare HMO | Source: Ambulatory Visit | Attending: Thoracic Surgery (Cardiothoracic Vascular Surgery) | Admitting: Thoracic Surgery (Cardiothoracic Vascular Surgery)

## 2020-11-01 DIAGNOSIS — R911 Solitary pulmonary nodule: Secondary | ICD-10-CM | POA: Diagnosis not present

## 2020-11-01 DIAGNOSIS — J439 Emphysema, unspecified: Secondary | ICD-10-CM | POA: Insufficient documentation

## 2020-11-01 LAB — GLUCOSE, CAPILLARY: Glucose-Capillary: 90 mg/dL (ref 70–99)

## 2020-11-01 MED ORDER — FLUDEOXYGLUCOSE F - 18 (FDG) INJECTION
8.1900 | Freq: Once | INTRAVENOUS | Status: AC
  Administered 2020-11-01: 8.19 via INTRAVENOUS

## 2020-11-02 ENCOUNTER — Other Ambulatory Visit (HOSPITAL_COMMUNITY): Payer: Medicare HMO

## 2020-11-02 ENCOUNTER — Ambulatory Visit: Payer: Medicare HMO | Admitting: Thoracic Surgery (Cardiothoracic Vascular Surgery)

## 2020-11-02 ENCOUNTER — Other Ambulatory Visit (HOSPITAL_COMMUNITY)
Admission: RE | Admit: 2020-11-02 | Discharge: 2020-11-02 | Disposition: A | Discharge status: Home / Self Care | Payer: Medicare HMO | Source: Ambulatory Visit | Attending: Thoracic Surgery (Cardiothoracic Vascular Surgery) | Admitting: Thoracic Surgery (Cardiothoracic Vascular Surgery)

## 2020-11-02 DIAGNOSIS — Z20822 Contact with and (suspected) exposure to covid-19: Secondary | ICD-10-CM | POA: Insufficient documentation

## 2020-11-02 DIAGNOSIS — Z01812 Encounter for preprocedural laboratory examination: Secondary | ICD-10-CM | POA: Insufficient documentation

## 2020-11-02 LAB — SARS CORONAVIRUS 2 (TAT 6-24 HRS): SARS Coronavirus 2: NEGATIVE

## 2020-11-04 ENCOUNTER — Other Ambulatory Visit: Payer: Self-pay

## 2020-11-04 ENCOUNTER — Ambulatory Visit (HOSPITAL_COMMUNITY)
Admission: RE | Admit: 2020-11-04 | Discharge: 2020-11-04 | Disposition: A | Discharge status: Home / Self Care | Payer: Medicare HMO | Source: Ambulatory Visit | Attending: Thoracic Surgery (Cardiothoracic Vascular Surgery) | Admitting: Thoracic Surgery (Cardiothoracic Vascular Surgery)

## 2020-11-04 DIAGNOSIS — R911 Solitary pulmonary nodule: Secondary | ICD-10-CM

## 2020-11-04 DIAGNOSIS — J449 Chronic obstructive pulmonary disease, unspecified: Secondary | ICD-10-CM | POA: Diagnosis not present

## 2020-11-04 LAB — PULMONARY FUNCTION TEST
DL/VA % pred: 49 %
DL/VA: 2.11 ml/min/mmHg/L
DLCO unc % pred: 54 %
DLCO unc: 15.24 ml/min/mmHg
FEF 25-75 Post: 0.75 L/sec
FEF 25-75 Pre: 0.8 L/sec
FEF2575-%Change-Post: -5 %
FEF2575-%Pred-Post: 24 %
FEF2575-%Pred-Pre: 25 %
FEV1-%Change-Post: -4 %
FEV1-%Pred-Post: 58 %
FEV1-%Pred-Pre: 61 %
FEV1-Post: 2.14 L
FEV1-Pre: 2.25 L
FEV1FVC-%Change-Post: -3 %
FEV1FVC-%Pred-Pre: 56 %
FEV6-%Change-Post: 0 %
FEV6-%Pred-Post: 95 %
FEV6-%Pred-Pre: 96 %
FEV6-Post: 4.43 L
FEV6-Pre: 4.44 L
FEV6FVC-%Change-Post: 0 %
FEV6FVC-%Pred-Post: 90 %
FEV6FVC-%Pred-Pre: 89 %
FVC-%Change-Post: -1 %
FVC-%Pred-Post: 106 %
FVC-%Pred-Pre: 107 %
FVC-Post: 5.14 L
FVC-Pre: 5.19 L
Post FEV1/FVC ratio: 42 %
Post FEV6/FVC ratio: 86 %
Pre FEV1/FVC ratio: 43 %
Pre FEV6/FVC Ratio: 86 %
RV % pred: 196 %
RV: 4.32 L
TLC % pred: 138 %
TLC: 9.7 L

## 2020-11-04 MED ORDER — ALBUTEROL SULFATE (2.5 MG/3ML) 0.083% IN NEBU
2.5000 mg | INHALATION_SOLUTION | Freq: Once | RESPIRATORY_TRACT | Status: AC
  Administered 2020-11-04: 2.5 mg via RESPIRATORY_TRACT

## 2020-11-17 ENCOUNTER — Encounter: Payer: Self-pay | Admitting: Thoracic Surgery (Cardiothoracic Vascular Surgery)

## 2020-11-17 ENCOUNTER — Other Ambulatory Visit: Payer: Self-pay

## 2020-11-17 ENCOUNTER — Ambulatory Visit (INDEPENDENT_AMBULATORY_CARE_PROVIDER_SITE_OTHER): Payer: Medicare HMO | Admitting: Thoracic Surgery (Cardiothoracic Vascular Surgery)

## 2020-11-17 VITALS — BP 128/75 | HR 105 | Ht 70.0 in | Wt 162.0 lb

## 2020-11-17 DIAGNOSIS — R911 Solitary pulmonary nodule: Secondary | ICD-10-CM | POA: Diagnosis not present

## 2020-11-17 NOTE — Progress Notes (Signed)
[address]       [name] [address]             [phone]       HPI: [name] returns to discuss the results of his PET CT and PFTs.  Sadrac Zeoli is a 60 year old man with a history of tobacco abuse, COPD, anxiety, hyperlipidemia, polyneuropathy, and chronic neck and back pain.  He has about a 75-pack-year history of smoking.  He quit in 2017.  He recently had a low-dose screening CT which showed a new spiculated nodule in the left upper lobe that was not present 6 months ago.  I saw him in the office on 10/11/2020.  I recommended that we do a PET/CT and pulmonary function testing before deciding how to proceed.  He continues to have some wheezing.  Nothing above his baseline.  He does use an inhaler.  He denies any fevers chills or sweats.  Past Medical History:  Diagnosis Date  . COPD (chronic obstructive pulmonary disease) (HCC)   . Generalized anxiety disorder   . Hyperlipidemia   . Insomnia   . Polyneuropathy      Current Outpatient Medications  Medication Sig Dispense Refill  . albuterol (VENTOLIN HFA) 108 (90 Base) MCG/ACT inhaler Inhale 2 puffs into the lungs every 6 (six) hours as needed for wheezing or shortness of breath.    . ALPRAZolam (XANAX) 1 MG tablet Take 1 mg by mouth at bedtime as needed for anxiety.    . ASPIRIN LOW DOSE 81 MG EC tablet Take 81 mg by mouth daily.    Marland Kitchen atorvastatin (LIPITOR) 10 MG tablet Take 10 mg by mouth daily.    Marland Kitchen buPROPion (WELLBUTRIN XL) 300 MG 24 hr tablet Take 300 mg by mouth daily.    . cyclobenzaprine (FLEXERIL) 10 MG tablet Take 10 mg by mouth 3 (three) times daily as needed for muscle spasms.    Marland Kitchen HYDROcodone-homatropine (HYCODAN) 5-1.5 MG/5ML syrup Take 5 mLs by mouth every 6 (six) hours as needed for cough. 120 mL 0  . promethazine (PHENERGAN) 25 MG tablet Take 25 mg by mouth every 6 (six) hours as needed for nausea or vomiting.    . SYMBICORT 160-4.5 MCG/ACT inhaler Inhale 2 puffs into the lungs  2 (two) times daily.     No current facility-administered medications for this visit.    Physical Exam BP 128/75 (BP Location: Right Arm, Patient Position: Sitting)   Pulse (!) 105   Ht 5\' 10"  (1.778 m)   Wt 162 lb (73.5 kg)   SpO2 92% Comment: RA  BMI 23.6 kg/m  60 year old man in no acute distress Alert and oriented x3 with no focal deficits Lungs wheezing in both bases Cardiac regular rate and rhythm No cervical or supraclavicular adenopathy  Diagnostic Tests: NUCLEAR MEDICINE PET SKULL BASE TO THIGH  TECHNIQUE: 8.2 mCi F-18 FDG was injected intravenously. Full-ring PET imaging was performed from the skull base to thigh after the radiotracer. CT data was obtained and used for attenuation correction and anatomic localization.  Fasting blood glucose: 90 mg/dl  COMPARISON:  Lung cancer screening chest CT on 09/21/2020  FINDINGS: Mediastinal blood-pool activity (background): SUV max = 2.3  Liver activity (reference): SUV max = N/A  NECK:  No hypermetabolic lymph nodes or masses.  Incidental CT findings: Muscular FDG uptake incidentally noted in the left neck.  CHEST: 5 mm nodule is seen in the left upper lobe on image 16/8, which is  decreased in size from 8 mm on previous study. This shows mild FDG uptake, with SUV max of 1.7.  A new adjacent ill-defined nodular opacity is seen in the left lung apex measuring 2.0 x 1.1 cm. This shows intense FDG uptake with SUV max of 11.2, is most consistent with an acute inflammatory/infectious etiology as it is new from previous study approximately 6 weeks ago. A new 10 mm irregular nodular opacity is also seen in the right middle lobe abutting the right heart border on image 47/8. This shows mild FDG uptake with SUV max of 1.7, and is also most consistent with acute inflammatory/infectious etiology.  A 7 mm pulmonary nodule in the right upper lobe on image 38/8 remains stable and shows minimal FDG uptake with SUV  max of 1.0. Other scattered tiny less than 5 mm pulmonary nodules remain stable, and are too small to characterize by PET.  Asymmetric FDG uptake is noted in the right hilum with SUV max of 3.4. No other hypermetabolic lymphadenopathy.  Incidental CT findings: Moderate centrilobular and paraseptal emphysema again seen.  ABDOMEN/PELVIS: No abnormal hypermetabolic activity within the liver, pancreas, adrenal glands, or spleen. No hypermetabolic lymph nodes in the abdomen or pelvis.  Incidental CT findings:  None.  SKELETON: No focal hypermetabolic bone lesions to suggest skeletal metastasis.  Incidental CT findings:  None.  IMPRESSION: Mild decrease in size of suspicious left upper lobe pulmonary nodule seen on previous study, most consistent with resolving inflammatory/infectious etiology.  New adjacent focal left upper lobe opacity and 10 mm right middle lobe nodule, both of which show FDG uptake. These are also consistent with acute inflammatory/infectious process given new appearance since recent exam 6 weeks ago. Recommend follow-up by chest CT without contrast in 3 months to evaluate persistence.  Mild asymmetric hypermetabolic activity in the right hilum, likely reactive in etiology.   Electronically Signed   By: Danae Orleans M.D.   On: 11/02/2020 10:14 I personally reviewed the PET/CT images.  There has been an interval decrease in the size of the new pulmonary nodule from his low-dose screening CT.  There is a new area of solid nodule in an area that had only minimal changes a month ago that is about 2 cm in diameter and has marked hypermetabolic activity.  There is some other areas of low-grade metabolic activity on the right side.  Impression: Steven Salinas is a 60 year old man with a history of tobacco abuse, COPD, anxiety, hyperlipidemia, polyneuropathy, and chronic neck and back pain.  He has about a 75-pack-year history of smoking.  He quit in 2017.   He recently had a low-dose screening CT which showed a new spiculated nodule in the left upper lobe.  The PET/CT shows some unexpected findings.  The nodule that was new on his low-dose screening CT actually is a little better.  There is an area more central and that that has gone from a vague groundglass density to a 2 cm solid nodule with an SUV of 11 and a month.  That is much more consistent with infectious or inflammatory nodule that would be malignancy.  There are some other nodules that have popped up as well.  He does not have a good explanation for an infectious nodule.  He does not have any fevers, chills, or sweats.  He has not been around anybody who has had pulmonary illnesses.  No history of COVID (although this would be unusual for COVID).  I recommended him that given the waxing and  waning nature of these nodules that we not proceed with any type of surgical resection.  I think the best option would be to repeat a CT in about 6 weeks.  That will be 8 weeks from his PET/CT.  He understands that the findings do not rule out the possibility of a cancer.  Just that it would be highly unusual for cancer to behave in this fashion.  Plan: Return in 6 weeks with CT chest.  We will plan to use super D protocol  I spent over 20 minutes in review of records, images, and in consultation with Mr. Kennerson today. Loreli Slot, MD [facility] [phone]

## 2020-12-20 ENCOUNTER — Other Ambulatory Visit: Payer: Self-pay | Admitting: Thoracic Surgery (Cardiothoracic Vascular Surgery)

## 2020-12-20 DIAGNOSIS — R918 Other nonspecific abnormal finding of lung field: Secondary | ICD-10-CM

## 2021-01-25 ENCOUNTER — Ambulatory Visit (INDEPENDENT_AMBULATORY_CARE_PROVIDER_SITE_OTHER): Payer: Medicare HMO | Admitting: Thoracic Surgery (Cardiothoracic Vascular Surgery)

## 2021-01-25 ENCOUNTER — Encounter: Payer: Self-pay | Admitting: *Deleted

## 2021-01-25 ENCOUNTER — Other Ambulatory Visit: Payer: Self-pay

## 2021-01-25 ENCOUNTER — Other Ambulatory Visit: Payer: Self-pay | Admitting: *Deleted

## 2021-01-25 ENCOUNTER — Ambulatory Visit
Admission: RE | Admit: 2021-01-25 | Discharge: 2021-01-25 | Disposition: A | Discharge status: Home / Self Care | Payer: Medicare HMO | Source: Ambulatory Visit | Attending: Thoracic Surgery (Cardiothoracic Vascular Surgery) | Admitting: Thoracic Surgery (Cardiothoracic Vascular Surgery)

## 2021-01-25 VITALS — BP 136/69 | HR 74 | Resp 20 | Ht 70.0 in | Wt 163.0 lb

## 2021-01-25 DIAGNOSIS — R911 Solitary pulmonary nodule: Secondary | ICD-10-CM

## 2021-01-25 DIAGNOSIS — R918 Other nonspecific abnormal finding of lung field: Secondary | ICD-10-CM

## 2021-01-25 NOTE — H&P (View-Only) (Signed)
[address]       [name] [address]             [phone]     HPI: Steven Salinas returns for scheduled follow-up visit  Steven Salinas is a 60 year old man with a history of tobacco abuse, COPD, anxiety, hyperlipidemia, polyneuropathy, and chronic neck and back pain.  He has a 75-pack-year history of smoking prior to quitting in 2017.  In February he had a low-dose screening CT which showed a new spiculated nodule in the left upper lobe that had not been present 6 months previously.  I saw him in the office in March.  We did a PET/CT and pulmonary function testing.  On the PET CT there was a decrease in the size of the suspicious nodule.  There was a new focal left upper lobe opacity where there had been no abnormality a month prior.  That was hypermetabolic on PET/CT.  There also was a right lung nodule that had increased in size and was hypermetabolic.  Findings were felt to be consistent with infectious inflammatory process.    He now returns with a repeat CT scan.  He has been feeling well.  He has not had any respiratory issues recently.  No fevers chills or sweats.  No change in appetite or weight loss.  Past Medical History:  Diagnosis Date   COPD (chronic obstructive pulmonary disease) (HCC)    Generalized anxiety disorder    Hyperlipidemia    Insomnia    Polyneuropathy     Current Outpatient Medications  Medication Sig Dispense Refill   albuterol (VENTOLIN HFA) 108 (90 Base) MCG/ACT inhaler Inhale 2 puffs into the lungs every 6 (six) hours as needed for wheezing or shortness of breath.     ALPRAZolam (XANAX) 1 MG tablet Take 1 mg by mouth at bedtime as needed for anxiety.     ASPIRIN LOW DOSE 81 MG EC tablet Take 81 mg by mouth daily.     atorvastatin (LIPITOR) 10 MG tablet Take 10 mg by mouth daily.     buPROPion (WELLBUTRIN XL) 300 MG 24 hr tablet Take 300 mg by mouth daily.     cyclobenzaprine (FLEXERIL) 10 MG tablet Take 10 mg by mouth 3  (three) times daily as needed for muscle spasms.     HYDROcodone-homatropine (HYCODAN) 5-1.5 MG/5ML syrup Take 5 mLs by mouth every 6 (six) hours as needed for cough. 120 mL 0   promethazine (PHENERGAN) 25 MG tablet Take 25 mg by mouth every 6 (six) hours as needed for nausea or vomiting.     SYMBICORT 160-4.5 MCG/ACT inhaler Inhale 2 puffs into the lungs 2 (two) times daily.     No current facility-administered medications for this visit.    Physical Exam BP 136/69   Pulse 74   Resp 20   Ht 5\' 10"  (1.778 m)   Wt 163 lb (73.9 kg)   SpO2 94% Comment: RA  BMI 23.21 kg/m  60 year old man in no acute distress Anxious Alert and oriented x3 with no focal deficits Well-developed and well-nourished Lungs slightly diminished breath sounds bilaterally with no wheezing Cardiac regular rate and rhythm  Diagnostic Tests: CT CHEST WITHOUT CONTRAST  TECHNIQUE: Multidetector CT imaging of the chest was performed using thin slice collimation for electromagnetic bronchoscopy planning purposes, without intravenous contrast.  COMPARISON: CT imaging from September 21, 2020.  FINDINGS: Cardiovascular: Scattered calcified atheromatous plaque in the thoracic aorta. No aneurysmal dilation. Normal caliber  of the central pulmonary vessels. Normal heart size without pericardial effusion.  Mediastinum/Nodes: Patulous esophagus, mildly patulous and unchanged from previous imaging.  Thoracic inlet structures are normal.  No mediastinal adenopathy.  No gross hilar adenopathy.  No axillary or thoracic inlet lymphadenopathy.  Lungs/Pleura: Marked interval increase in size of an area in the LEFT upper lobe adjacent to bullous changes (image 33/8). The new area of soft tissue seen on the previous imaging study adjacent to the pre-existing small nodular area now shows confluent soft tissue measuring 3.5 x 1.7 cm.  Background pulmonary emphysema as on previous imaging.  No sign of pleural  effusion.  Minimal ground-glass at the RIGHT lung base associated with subtle ill-defined nodules more likely inflammatory and associated with mild bronchial wall thickening.  New pleural based nodularity evident in the upper lobe (image 59/8) 4-5 mm.  3 mm nodule on image 52 of series 8 also new in the LEFT upper lobe.  Upper Abdomen: Incidental imaging of upper abdominal contents without acute process. Imaged portions the liver, gallbladder, adrenal glands and kidneys, spleen, pancreas and limited assessment of gastrointestinal tract without acute process. No upper abdominal lymphadenopathy.  Musculoskeletal: Osteopenia. Spinal degenerative changes. No acute or destructive bone process.  IMPRESSION: 1. Marked interval increase in size of an area of soft tissue in the LEFT upper lobe adjacent to the pre-existing bullous changes. Findings are concerning first and foremost for bronchogenic neoplasm given persistence/enlargement. Referral to multi disciplinary thoracic oncologic setting if not yet performed with PET scan and or biopsy as warranted. 2. New scattered small nodules in the upper lobe as described. Attention on follow-up. 3. Post inflammatory changes in the lower lobes. 4. Emphysema and aortic atherosclerosis.  Aortic Atherosclerosis (ICD10-I70.0) and Emphysema (ICD10-J43.9).   Electronically Signed By: Donzetta Kohut M.D. On: 01/25/2021 09:26  I personally reviewed the CT images.  There has been progression of the left upper lobe process that was new on his last CT.  I suspect this is more likely infectious inflammatory than malignant.  There there are other new scattered nodules in the upper lobe as well.  The right lung nodule that has increased in size between his previous 2 scans is actually essentially resolved as is the original suspicious left upper lobe nodule.  Impression: Steven Salinas is a 60 year old man with a history of tobacco abuse, COPD, anxiety,  hyperlipidemia, polyneuropathy, and chronic neck and back pain.  He has a 75-pack-year history of smoking prior to quitting in 2017.    Lung nodules - in February he had a low-dose screening CT which showed a new spiculated nodule in the left upper lobe that had not been present 6 months previously.  A PET/CT was done about 6 weeks later which showed a new left upper lobe nodule.  The original suspicious nodule had decreased in size.  There also was a new right lung nodule adjacent to the heart.  He now returns with a follow-up scan about 8 weeks later.  It shows an increase in the size of the nodule that was new in April.  Near complete resolution of the other previous nodules.  There are some new smaller nodules.  Overall I still think this most likely reflects infectious inflammatory disease.  I cannot completely rule out the possibility of cancer, but this would be highly unusual presentation.  At this point I do think it is reasonable to go ahead and do a bronchoscopic biopsy for both pathology and cultures.  I recommend we proceed  with navigational bronchoscopy for biopsy and culture.  I informed him of the general nature of the procedure including the need for general anesthesia.  He understands we would plan to do this on an outpatient basis.  We discussed the indications, risks, benefits, and alternatives.  He understands there is no guarantee of a definitive diagnosis.  He understands the risks include those associated with general anesthesia, as well as inability to make a definitive diagnosis, pneumothorax, bleeding, as well as possibility of other unforeseeable complications.  Plan: Navigational bronchoscopy for biopsy and cultures on Monday, 02/21/2021  Loreli Slot, MD [facility] [phone]

## 2021-01-25 NOTE — Progress Notes (Signed)
[address]       [place] [address]             [phone]     HPI: [name] returns for scheduled follow-up visit  Steven Salinas is a 60 year old man with a history of tobacco abuse, COPD, anxiety, hyperlipidemia, polyneuropathy, and chronic neck and back pain.  He has a 75-pack-year history of smoking prior to quitting in 2017.  In February he had a low-dose screening CT which showed a new spiculated nodule in the left upper lobe that had not been present 6 months previously.  I saw him in the office in March.  We did a PET/CT and pulmonary function testing.  On the PET CT there was a decrease in the size of the suspicious nodule.  There was a new focal left upper lobe opacity where there had been no abnormality a month prior.  That was hypermetabolic on PET/CT.  There also was a right lung nodule that had increased in size and was hypermetabolic.  Findings were felt to be consistent with infectious inflammatory process.    He now returns with a repeat CT scan.  He has been feeling well.  He has not had any respiratory issues recently.  No fevers chills or sweats.  No change in appetite or weight loss.  Past Medical History:  Diagnosis Date   COPD (chronic obstructive pulmonary disease) (HCC)    Generalized anxiety disorder    Hyperlipidemia    Insomnia    Polyneuropathy     Current Outpatient Medications  Medication Sig Dispense Refill   albuterol (VENTOLIN HFA) 108 (90 Base) MCG/ACT inhaler Inhale 2 puffs into the lungs every 6 (six) hours as needed for wheezing or shortness of breath.     ALPRAZolam (XANAX) 1 MG tablet Take 1 mg by mouth at bedtime as needed for anxiety.     ASPIRIN LOW DOSE 81 MG EC tablet Take 81 mg by mouth daily.     atorvastatin (LIPITOR) 10 MG tablet Take 10 mg by mouth daily.     buPROPion (WELLBUTRIN XL) 300 MG 24 hr tablet Take 300 mg by mouth daily.     cyclobenzaprine (FLEXERIL) 10 MG tablet Take 10 mg by mouth 3  (three) times daily as needed for muscle spasms.     HYDROcodone-homatropine (HYCODAN) 5-1.5 MG/5ML syrup Take 5 mLs by mouth every 6 (six) hours as needed for cough. 120 mL 0   promethazine (PHENERGAN) 25 MG tablet Take 25 mg by mouth every 6 (six) hours as needed for nausea or vomiting.     SYMBICORT 160-4.5 MCG/ACT inhaler Inhale 2 puffs into the lungs 2 (two) times daily.     No current facility-administered medications for this visit.    Physical Exam BP 136/69   Pulse 74   Resp 20   Ht 5\' 10"  (1.778 m)   Wt 163 lb (73.9 kg)   SpO2 94% Comment: RA  BMI 23.21 kg/m  60 year old man in no acute distress Anxious Alert and oriented x3 with no focal deficits Well-developed and well-nourished Lungs slightly diminished breath sounds bilaterally with no wheezing Cardiac regular rate and rhythm  Diagnostic Tests: CT CHEST WITHOUT CONTRAST  TECHNIQUE: Multidetector CT imaging of the chest was performed using thin slice collimation for electromagnetic bronchoscopy planning purposes, without intravenous contrast.  COMPARISON: CT imaging from September 21, 2020.  FINDINGS: Cardiovascular: Scattered calcified atheromatous plaque in the thoracic aorta. No aneurysmal dilation. Normal caliber  of the central pulmonary vessels. Normal heart size without pericardial effusion.  Mediastinum/Nodes: Patulous esophagus, mildly patulous and unchanged from previous imaging.  Thoracic inlet structures are normal.  No mediastinal adenopathy.  No gross hilar adenopathy.  No axillary or thoracic inlet lymphadenopathy.  Lungs/Pleura: Marked interval increase in size of an area in the LEFT upper lobe adjacent to bullous changes (image 33/8). The new area of soft tissue seen on the previous imaging study adjacent to the pre-existing small nodular area now shows confluent soft tissue measuring 3.5 x 1.7 cm.  Background pulmonary emphysema as on previous imaging.  No sign of pleural  effusion.  Minimal ground-glass at the RIGHT lung base associated with subtle ill-defined nodules more likely inflammatory and associated with mild bronchial wall thickening.  New pleural based nodularity evident in the upper lobe (image 59/8) 4-5 mm.  3 mm nodule on image 52 of series 8 also new in the LEFT upper lobe.  Upper Abdomen: Incidental imaging of upper abdominal contents without acute process. Imaged portions the liver, gallbladder, adrenal glands and kidneys, spleen, pancreas and limited assessment of gastrointestinal tract without acute process. No upper abdominal lymphadenopathy.  Musculoskeletal: Osteopenia. Spinal degenerative changes. No acute or destructive bone process.  IMPRESSION: 1. Marked interval increase in size of an area of soft tissue in the LEFT upper lobe adjacent to the pre-existing bullous changes. Findings are concerning first and foremost for bronchogenic neoplasm given persistence/enlargement. Referral to multi disciplinary thoracic oncologic setting if not yet performed with PET scan and or biopsy as warranted. 2. New scattered small nodules in the upper lobe as described. Attention on follow-up. 3. Post inflammatory changes in the lower lobes. 4. Emphysema and aortic atherosclerosis.  Aortic Atherosclerosis (ICD10-I70.0) and Emphysema (ICD10-J43.9).   Electronically Signed By: Donzetta Kohut M.D. On: 01/25/2021 09:26  I personally reviewed the CT images.  There has been progression of the left upper lobe process that was new on his last CT.  I suspect this is more likely infectious inflammatory than malignant.  There there are other new scattered nodules in the upper lobe as well.  The right lung nodule that has increased in size between his previous 2 scans is actually essentially resolved as is the original suspicious left upper lobe nodule.  Impression: Steven Salinas is a 60 year old man with a history of tobacco abuse, COPD, anxiety,  hyperlipidemia, polyneuropathy, and chronic neck and back pain.  He has a 75-pack-year history of smoking prior to quitting in 2017.    Lung nodules - in February he had a low-dose screening CT which showed a new spiculated nodule in the left upper lobe that had not been present 6 months previously.  A PET/CT was done about 6 weeks later which showed a new left upper lobe nodule.  The original suspicious nodule had decreased in size.  There also was a new right lung nodule adjacent to the heart.  He now returns with a follow-up scan about 8 weeks later.  It shows an increase in the size of the nodule that was new in April.  Near complete resolution of the other previous nodules.  There are some new smaller nodules.  Overall I still think this most likely reflects infectious inflammatory disease.  I cannot completely rule out the possibility of cancer, but this would be highly unusual presentation.  At this point I do think it is reasonable to go ahead and do a bronchoscopic biopsy for both pathology and cultures.  I recommend we proceed  with navigational bronchoscopy for biopsy and culture.  I informed him of the general nature of the procedure including the need for general anesthesia.  He understands we would plan to do this on an outpatient basis.  We discussed the indications, risks, benefits, and alternatives.  He understands there is no guarantee of a definitive diagnosis.  He understands the risks include those associated with general anesthesia, as well as inability to make a definitive diagnosis, pneumothorax, bleeding, as well as possibility of other unforeseeable complications.  Plan: Navigational bronchoscopy for biopsy and cultures on Monday, 02/21/2021  Loreli Slot, MD [facility] [phone]

## 2021-02-16 NOTE — Pre-Procedure Instructions (Signed)
Surgical Instructions    Your procedure is scheduled on Monday July 25th.   Report to [facility] Main Entrance "A" at 05:30 A.M., then check in with the Admitting office.  Call this number if you have problems the morning of surgery:  [phone]   If you have any questions prior to your surgery date call [phone]: Open Monday-Friday 8am-4pm    Remember:  Do not eat or drink after midnight the night before your surgery     Take these medicines the morning of surgery with A SIP OF WATER   albuterol (VENTOLIN HFA) 108 (90 Base) MCG/ACT inhaler- If needed.   ALPRAZolam Prudy Feeler)- If needed  cyclobenzaprine (FLEXERIL)- If needed  Naphazoline HCl (CLEAR EYES OP)- If needed  promethazine (PHENERGAN)- If needed  atorvastatin (LIPITOR)  buPROPion (WELLBUTRIN XL)  SYMBICORT 160-4.5 MCG/ACT inhaler Please bring your inhalers with you on the day of surgery.    As of today, STOP taking any Aspirin (unless otherwise instructed by your surgeon) Aleve, Naproxen, Ibuprofen, Motrin, Advil, Goody's, BC's, all herbal medications, fish oil, and all vitamins.                     Do NOT Smoke (Tobacco/Vaping) or drink Alcohol 24 hours prior to your procedure.  If you use a CPAP at night, you may bring all equipment for your overnight stay.   Contacts, glasses, piercing's, hearing aid's, dentures or partials may not be worn into surgery, please bring cases for these belongings.    For patients admitted to the hospital, discharge time will be determined by your treatment team.   Patients discharged the day of surgery will not be allowed to drive home, and someone needs to stay with them for 24 hours.  ONLY 1 SUPPORT PERSON MAY BE PRESENT WHILE YOU ARE IN SURGERY. IF YOU ARE TO BE ADMITTED ONCE YOU ARE IN YOUR ROOM YOU WILL BE ALLOWED TWO (2) VISITORS.  Minor children may have two parents present. Special consideration for safety and communication needs will be reviewed on a case by case  basis.   Special instructions:   [place]- Preparing For Surgery  Before surgery, you can play an important role. Because skin is not sterile, your skin needs to be as free of germs as possible. You can reduce the number of germs on your skin by washing with CHG (chlorahexidine gluconate) Soap before surgery.  CHG is an antiseptic cleaner which kills germs and bonds with the skin to continue killing germs even after washing.    Oral Hygiene is also important to reduce your risk of infection.  Remember - BRUSH YOUR TEETH THE MORNING OF SURGERY WITH YOUR REGULAR TOOTHPASTE  Please do not use if you have an allergy to CHG or antibacterial soaps. If your skin becomes reddened/irritated stop using the CHG.  Do not shave (including legs and underarms) for at least 48 hours prior to first CHG shower. It is OK to shave your face.  Please follow these instructions carefully.   Shower the NIGHT BEFORE SURGERY and the MORNING OF SURGERY  If you chose to wash your hair, wash your hair first as usual with your normal shampoo.  After you shampoo, rinse your hair and body thoroughly to remove the shampoo.  Use CHG Soap as you would any other liquid soap. You can apply CHG directly to the skin and wash gently with a scrungie or a clean washcloth.   Apply the CHG Soap to your body ONLY  FROM THE NECK DOWN.  Do not use on open wounds or open sores. Avoid contact with your eyes, ears, mouth and genitals (private parts). Wash Face and genitals (private parts)  with your normal soap.   Wash thoroughly, paying special attention to the area where your surgery will be performed.  Thoroughly rinse your body with warm water from the neck down.  DO NOT shower/wash with your normal soap after using and rinsing off the CHG Soap.  Pat yourself dry with a CLEAN TOWEL.  Wear CLEAN PAJAMAS to bed the night before surgery  Place CLEAN SHEETS on your bed the night before your surgery  DO NOT SLEEP WITH  PETS.   Day of Surgery: Shower with CHG soap. Do not wear jewelry, make up, nail polish, gel polish, artificial nails, or any other type of covering on natural nails including finger and toenails. If patients have artificial nails, gel coating, etc. that need to be removed by a nail salon please have this removed prior to surgery. Surgery may need to be canceled/delayed if the surgeon/ anesthesia feels like the patient is unable to be adequately monitored. Do not wear lotions, powders, perfumes/colognes, or deodorant. Do not shave 48 hours prior to surgery.  Men may shave face and neck. Do not bring valuables to the hospital. [facility] is not responsible for any belongings or valuables. Wear Clean/Comfortable clothing the morning of surgery Remember to brush your teeth WITH YOUR REGULAR TOOTHPASTE.   Please read over the following fact sheets that you were given.

## 2021-02-17 ENCOUNTER — Ambulatory Visit (HOSPITAL_COMMUNITY)
Admission: RE | Admit: 2021-02-17 | Discharge: 2021-02-17 | Disposition: A | Discharge status: Home / Self Care | Payer: Medicare HMO | Source: Ambulatory Visit | Attending: Thoracic Surgery (Cardiothoracic Vascular Surgery) | Admitting: Thoracic Surgery (Cardiothoracic Vascular Surgery)

## 2021-02-17 ENCOUNTER — Encounter (HOSPITAL_COMMUNITY): Payer: Self-pay

## 2021-02-17 ENCOUNTER — Encounter (HOSPITAL_COMMUNITY)
Admission: RE | Admit: 2021-02-17 | Discharge: 2021-02-17 | Disposition: A | Discharge status: Home / Self Care | Payer: Medicare HMO | Source: Ambulatory Visit | Attending: Thoracic Surgery (Cardiothoracic Vascular Surgery) | Admitting: Thoracic Surgery (Cardiothoracic Vascular Surgery)

## 2021-02-17 ENCOUNTER — Other Ambulatory Visit: Payer: Self-pay

## 2021-02-17 DIAGNOSIS — R911 Solitary pulmonary nodule: Secondary | ICD-10-CM | POA: Insufficient documentation

## 2021-02-17 DIAGNOSIS — J449 Chronic obstructive pulmonary disease, unspecified: Secondary | ICD-10-CM | POA: Diagnosis not present

## 2021-02-17 DIAGNOSIS — R0789 Other chest pain: Secondary | ICD-10-CM | POA: Diagnosis not present

## 2021-02-17 DIAGNOSIS — Z87891 Personal history of nicotine dependence: Secondary | ICD-10-CM | POA: Insufficient documentation

## 2021-02-17 DIAGNOSIS — Z01818 Encounter for other preprocedural examination: Secondary | ICD-10-CM | POA: Diagnosis not present

## 2021-02-17 DIAGNOSIS — I700 Atherosclerosis of aorta: Secondary | ICD-10-CM | POA: Insufficient documentation

## 2021-02-17 DIAGNOSIS — Z7951 Long term (current) use of inhaled steroids: Secondary | ICD-10-CM | POA: Insufficient documentation

## 2021-02-17 LAB — COMPREHENSIVE METABOLIC PANEL
ALT: 18 U/L (ref 0–44)
AST: 19 U/L (ref 15–41)
Albumin: 3.7 g/dL (ref 3.5–5.0)
Alkaline Phosphatase: 115 U/L (ref 38–126)
Anion gap: 7 (ref 5–15)
BUN: 23 mg/dL — ABNORMAL HIGH (ref 6–20)
CO2: 25 mmol/L (ref 22–32)
Calcium: 9.2 mg/dL (ref 8.9–10.3)
Chloride: 104 mmol/L (ref 98–111)
Creatinine, Ser: 1.01 mg/dL (ref 0.61–1.24)
GFR, Estimated: >60 mL/min (ref >60)
Glucose, Bld: 105 mg/dL — ABNORMAL HIGH (ref 70–99)
Potassium: 4 mmol/L (ref 3.5–5.1)
Sodium: 136 mmol/L (ref 135–145)
Total Bilirubin: 0.5 mg/dL (ref 0.3–1.2)
Total Protein: 7 g/dL (ref 6.5–8.1)

## 2021-02-17 LAB — CBC
HCT: 42.5 % (ref 39.0–52.0)
Hemoglobin: 13.8 g/dL (ref 13.0–17.0)
MCH: 29.6 pg (ref 26.0–34.0)
MCHC: 32.5 g/dL (ref 30.0–36.0)
MCV: 91 fL (ref 80.0–100.0)
Platelets: 454 10*3/uL — ABNORMAL HIGH (ref 150–400)
RBC: 4.67 MIL/uL (ref 4.22–5.81)
RDW: 14 % (ref 11.5–15.5)
WBC: 11.9 10*3/uL — ABNORMAL HIGH (ref 4.0–10.5)
nRBC: 0 % (ref 0.0–0.2)

## 2021-02-17 LAB — SURGICAL PCR SCREEN
MRSA, PCR: NEGATIVE
Staphylococcus aureus: POSITIVE — AB

## 2021-02-17 LAB — APTT: aPTT: 34 seconds (ref 24–36)

## 2021-02-17 LAB — PROTIME-INR
INR: 1 (ref 0.8–1.2)
Prothrombin Time: 13 seconds (ref 11.4–15.2)

## 2021-02-17 NOTE — Progress Notes (Signed)
Anesthesia Chart Review:  I spoke with patient preadmission testing due to his report of recent, brief episode of chest tightness.  He says about 2 weeks ago he was working in his yard in the heat weed eating and mowing when he experienced a brief sensation where he felt he could not get a deep breath.  He said he once he felt like he was able to take a deep breath, the sensation abated and he was able to return to the work he was doing without issue.  He denied any other related symptoms such as shortness of breath, diaphoresis, presyncope.  He denies any subsequent episodes of chest tightness.  He denies any history of cardiovascular disease.  Denies any syncopal events, chest pain, new DOE.  He has COPD and reports that this is fairly stable.  He can complete greater than 4 METS of activity.  He has a 75-pack-year history of smoking prior to quitting in 2017. He has recently been started on Symbicort. He also admits to significant anxiety.   On exam he is somewhat anxious, and admits to increased anxiety recently surrounding his pulmonary disease.  He is in no acute distress, breathing is unlabored, cardiac auscultation reveals RRR no M/R/G, lung auscultation reveals mildly diminished breath sounds, clear bilaterally. EKG showed NSR, rate 85.  Lung nodules have recently been followed by Dr. Dorris Fetch per note 01/17/2021, "in February he had a low-dose screening CT which showed a new spiculated nodule in the left upper lobe that had not been present 6 months previously.  A PET/CT was done about 6 weeks later which showed a new left upper lobe nodule.  The original suspicious nodule had decreased in size.  There also was a new right lung nodule adjacent to the heart.  He now returns with a follow-up scan about 8 weeks later.  It shows an increase in the size of the nodule that was new in April.  Near complete resolution of the other previous nodules.  There are some new smaller nodules.Marland KitchenMarland KitchenOverall I still think  this most likely reflects infectious inflammatory disease.  I cannot completely rule out the possibility of cancer, but this would be highly unusual presentation.  At this point I do think it is reasonable to go ahead and do a bronchoscopic biopsy for both pathology and cultures."  Preop labs reviewed, unremarkable.  EKG 02/17/2021: NSR.  Rate 85.  CHEST - 2 VIEW 02/17/2021: COMPARISON:  Chest CT-01/25/2021   PET-CT-11/01/2020   FINDINGS: Grossly unchanged cardiac silhouette and mediastinal contours. The lungs remain hyperexpanded with flattening of the diaphragms.   Previously identified left apical pulmonary nodule/mass has potentially reduced in size in the interval though differences may be attributable to differences in imaging modalities.   No new focal airspace opacities. No pleural effusion or pneumothorax. No evidence of edema. No acute osseous abnormalities.   IMPRESSION: 1. Previously identified left apical pulmonary nodule/mass has potentially reduced in size in the interval, though differences may be attributable to differences in imaging modalities. 2. Otherwise, similar findings of lung hyperexpansion and chronic bronchitic change without superimposed acute cardiopulmonary disease.   CT super D chest 01/17/2021: IMPRESSION: 1. Marked interval increase in size of an area of soft tissue in the LEFT upper lobe adjacent to the pre-existing bullous changes. Findings are concerning first and foremost for bronchogenic neoplasm given persistence/enlargement. Referral to multi disciplinary thoracic oncologic setting if not yet performed with PET scan and or biopsy as warranted. 2. New scattered small nodules in  the upper lobe as described. Attention on follow-up. 3. Post inflammatory changes in the lower lobes. 4. Emphysema and aortic atherosclerosis.   PFTs 11/04/2020: FVC-%Pred-Pre Latest Units: % 107  FEV1-%Pred-Pre Latest Units: % 61  FEV1FVC-%Pred-Pre Latest Units:  % 56  TLC % pred Latest Units: % 138  RV % pred Latest Units: % 196  DLCO unc % pred Latest Units: % 54    Onofrio, [facility] Short Stay Center/Anesthesiology Phone [phone] 02/18/2021 9:29 AM

## 2021-02-17 NOTE — Progress Notes (Signed)
PCP - Lianne Moris, [facility] Cardiologist - denies  PPM/ICD - n/a Device Orders - n/a Rep Notified - n/a  Chest x-ray - 02/17/21 EKG - 02/17/21 Stress Test - denies ECHO - denies Cardiac Cath - denies PFT- 11/04/20  Sleep Study - denies CPAP - n/a  Fasting Blood Sugar - n/a Checks Blood Sugar __n/a__ times a day  Blood Thinner Instructions: n/a Aspirin Instructions: As of today, STOP taking any Aspirin (unless otherwise instructed by your surgeon)  ERAS Protcol - No PRE-SURGERY Ensure or G2- n/a  COVID TEST- No. Ambulatory Surgery   Anesthesia review: Yes. Patient c/o chest pain approximately 1-2 weeks ago. Patient states he felt chest tightness with deep breaths. Antionette Poles, PA in anesthesia made aware and over to PAT to assess patient. EKG obtained today per Hetty Ely, PA.  Patient denies shortness of breath, fever, cough and chest pain at PAT appointment   All instructions explained to the patient, with a verbal understanding of the material. Patient agrees to go over the instructions while at home for a better understanding. Patient also instructed to self quarantine after being tested for COVID-19. The opportunity to ask questions was provided.

## 2021-02-18 NOTE — Progress Notes (Signed)
[employer] sent to Steven Salinas at Dr. Sunday Corn office to notify of PCR result staph positive.

## 2021-02-18 NOTE — Anesthesia Preprocedure Evaluation (Addendum)
Anesthesia Evaluation  Patient identified by MRN, date of birth, ID band Patient awake    Reviewed: Allergy & Precautions, NPO status , Patient's Chart, lab work & pertinent test results  Airway Mallampati: II  TM Distance: >3 FB Neck ROM: Full    Dental  (+) Edentulous Upper, Missing, Poor Dentition   Pulmonary COPD,  COPD inhaler, former smoker,    Pulmonary exam normal breath sounds clear to auscultation       Cardiovascular negative cardio ROS Normal cardiovascular exam Rhythm:Regular Rate:Normal  ECG: NSR, rate 85   Neuro/Psych PSYCHIATRIC DISORDERS Anxiety  Neuromuscular disease    GI/Hepatic negative GI ROS, Neg liver ROS,   Endo/Other  negative endocrine ROS  Renal/GU negative Renal ROS     Musculoskeletal negative musculoskeletal ROS (+)   Abdominal   Peds  Hematology HLD   Anesthesia Other Findings LUL NODULE  Reproductive/Obstetrics                           Anesthesia Physical Anesthesia Plan  ASA: 3  Anesthesia Plan: General   Post-op Pain Management:    Induction: Intravenous  PONV Risk Score and Plan: 2 and Ondansetron, Dexamethasone, Midazolam and Treatment may vary due to age or medical condition  Airway Management Planned: Oral ETT  Additional Equipment:   Intra-op Plan:   Post-operative Plan: Extubation in OR  Informed Consent: I have reviewed the patients History and Physical, chart, labs and discussed the procedure including the risks, benefits and alternatives for the proposed anesthesia with the patient or authorized representative who has indicated his/her understanding and acceptance.     Dental advisory given  Plan Discussed with: CRNA  Anesthesia Plan Comments: (Reviewed PAT note by Antionette Poles, PA-C )      Anesthesia Quick Evaluation

## 2021-02-21 ENCOUNTER — Encounter (HOSPITAL_COMMUNITY)
Admission: RE | Disposition: A | Discharge status: Home / Self Care | Payer: Self-pay | Source: Home / Self Care | Attending: Thoracic Surgery (Cardiothoracic Vascular Surgery)

## 2021-02-21 ENCOUNTER — Ambulatory Visit (HOSPITAL_COMMUNITY): Payer: Medicare HMO | Admitting: Physician Assistant

## 2021-02-21 ENCOUNTER — Ambulatory Visit (HOSPITAL_COMMUNITY)
Admission: RE | Admit: 2021-02-21 | Discharge: 2021-02-21 | Disposition: A | Discharge status: Home / Self Care | Payer: Medicare HMO | Attending: Thoracic Surgery (Cardiothoracic Vascular Surgery) | Admitting: Thoracic Surgery (Cardiothoracic Vascular Surgery)

## 2021-02-21 ENCOUNTER — Ambulatory Visit (HOSPITAL_COMMUNITY): Payer: Medicare HMO

## 2021-02-21 ENCOUNTER — Other Ambulatory Visit: Payer: Self-pay

## 2021-02-21 ENCOUNTER — Encounter (HOSPITAL_COMMUNITY): Payer: Self-pay | Admitting: Thoracic Surgery (Cardiothoracic Vascular Surgery)

## 2021-02-21 DIAGNOSIS — Z7951 Long term (current) use of inhaled steroids: Secondary | ICD-10-CM | POA: Diagnosis not present

## 2021-02-21 DIAGNOSIS — Z419 Encounter for procedure for purposes other than remedying health state, unspecified: Secondary | ICD-10-CM

## 2021-02-21 DIAGNOSIS — Z87891 Personal history of nicotine dependence: Secondary | ICD-10-CM | POA: Insufficient documentation

## 2021-02-21 DIAGNOSIS — F419 Anxiety disorder, unspecified: Secondary | ICD-10-CM | POA: Insufficient documentation

## 2021-02-21 DIAGNOSIS — E785 Hyperlipidemia, unspecified: Secondary | ICD-10-CM | POA: Diagnosis not present

## 2021-02-21 DIAGNOSIS — Z79899 Other long term (current) drug therapy: Secondary | ICD-10-CM | POA: Insufficient documentation

## 2021-02-21 DIAGNOSIS — Z7982 Long term (current) use of aspirin: Secondary | ICD-10-CM | POA: Diagnosis not present

## 2021-02-21 DIAGNOSIS — J439 Emphysema, unspecified: Secondary | ICD-10-CM | POA: Diagnosis not present

## 2021-02-21 DIAGNOSIS — Z20822 Contact with and (suspected) exposure to covid-19: Secondary | ICD-10-CM | POA: Insufficient documentation

## 2021-02-21 DIAGNOSIS — J8410 Pulmonary fibrosis, unspecified: Secondary | ICD-10-CM | POA: Insufficient documentation

## 2021-02-21 DIAGNOSIS — R911 Solitary pulmonary nodule: Secondary | ICD-10-CM

## 2021-02-21 HISTORY — PX: VIDEO BRONCHOSCOPY WITH ENDOBRONCHIAL NAVIGATION: SHX6175

## 2021-02-21 LAB — SARS CORONAVIRUS 2 BY RT PCR (HOSPITAL ORDER, PERFORMED IN ~~LOC~~ HOSPITAL LAB): SARS Coronavirus 2: NEGATIVE

## 2021-02-21 SURGERY — VIDEO BRONCHOSCOPY WITH ENDOBRONCHIAL NAVIGATION
Anesthesia: General | Site: Chest

## 2021-02-21 MED ORDER — LACTATED RINGERS IV SOLN: INTRAVENOUS | Status: DC

## 2021-02-21 MED ORDER — DEXAMETHASONE SODIUM PHOSPHATE 10 MG/ML IJ SOLN
INTRAMUSCULAR | Status: AC
  Filled 2021-02-21: qty 1

## 2021-02-21 MED ORDER — PROPOFOL 10 MG/ML IV BOLUS
INTRAVENOUS | Status: DC | PRN
  Administered 2021-02-21: 150 mg via INTRAVENOUS

## 2021-02-21 MED ORDER — ONDANSETRON HCL 4 MG/2ML IJ SOLN
INTRAMUSCULAR | Status: DC | PRN
  Administered 2021-02-21: 4 mg via INTRAVENOUS

## 2021-02-21 MED ORDER — PROMETHAZINE HCL 25 MG/ML IJ SOLN
6.2500 mg | INTRAMUSCULAR | Status: AC | PRN
  Administered 2021-02-21: 12.5 mg via INTRAVENOUS

## 2021-02-21 MED ORDER — PROPOFOL 10 MG/ML IV BOLUS
INTRAVENOUS | Status: AC
  Filled 2021-02-21: qty 40

## 2021-02-21 MED ORDER — PHENYLEPHRINE HCL-NACL 10-0.9 MG/250ML-% IV SOLN
INTRAVENOUS | Status: DC | PRN
  Administered 2021-02-21: 20 ug/min via INTRAVENOUS

## 2021-02-21 MED ORDER — EPINEPHRINE PF 1 MG/ML IJ SOLN
INTRAMUSCULAR | Status: AC
  Filled 2021-02-21: qty 1

## 2021-02-21 MED ORDER — OXYCODONE HCL 5 MG/5ML PO SOLN
5.0000 mg | Freq: Once | ORAL | Status: DC | PRN
Start: 2021-02-21 — End: 2021-02-21

## 2021-02-21 MED ORDER — ROCURONIUM BROMIDE 10 MG/ML (PF) SYRINGE
PREFILLED_SYRINGE | INTRAVENOUS | Status: DC | PRN
  Administered 2021-02-21: 40 mg via INTRAVENOUS
  Administered 2021-02-21: 10 mg via INTRAVENOUS

## 2021-02-21 MED ORDER — DEXAMETHASONE SODIUM PHOSPHATE 10 MG/ML IJ SOLN
INTRAMUSCULAR | Status: DC | PRN
  Administered 2021-02-21: 5 mg via INTRAVENOUS

## 2021-02-21 MED ORDER — ONDANSETRON HCL 4 MG/2ML IJ SOLN
INTRAMUSCULAR | Status: AC
  Filled 2021-02-21: qty 2

## 2021-02-21 MED ORDER — FENTANYL CITRATE (PF) 250 MCG/5ML IJ SOLN
INTRAMUSCULAR | Status: AC
  Filled 2021-02-21: qty 5

## 2021-02-21 MED ORDER — CHLORHEXIDINE GLUCONATE 0.12 % MT SOLN
15.0000 mL | Freq: Once | OROMUCOSAL | Status: AC
  Administered 2021-02-21: 15 mL via OROMUCOSAL
  Filled 2021-02-21: qty 15

## 2021-02-21 MED ORDER — FENTANYL CITRATE (PF) 100 MCG/2ML IJ SOLN
25.0000 ug | INTRAMUSCULAR | Status: DC | PRN
  Administered 2021-02-21: 25 ug via INTRAVENOUS

## 2021-02-21 MED ORDER — ACETAMINOPHEN 500 MG PO TABS
1000.0000 mg | ORAL_TABLET | Freq: Once | ORAL | Status: AC
  Administered 2021-02-21: 1000 mg via ORAL
  Filled 2021-02-21: qty 2

## 2021-02-21 MED ORDER — LIDOCAINE 2% (20 MG/ML) 5 ML SYRINGE
INTRAMUSCULAR | Status: AC
  Filled 2021-02-21: qty 5

## 2021-02-21 MED ORDER — ORAL CARE MOUTH RINSE: 15.0000 mL | Freq: Once | OROMUCOSAL | Status: AC

## 2021-02-21 MED ORDER — ROCURONIUM BROMIDE 10 MG/ML (PF) SYRINGE
PREFILLED_SYRINGE | INTRAVENOUS | Status: AC
  Filled 2021-02-21: qty 10

## 2021-02-21 MED ORDER — FENTANYL CITRATE (PF) 250 MCG/5ML IJ SOLN
INTRAMUSCULAR | Status: DC | PRN
  Administered 2021-02-21: 50 ug via INTRAVENOUS
  Administered 2021-02-21: 100 ug via INTRAVENOUS

## 2021-02-21 MED ORDER — 0.9 % SODIUM CHLORIDE (POUR BTL) OPTIME
TOPICAL | Status: DC | PRN
  Administered 2021-02-21: 1000 mL

## 2021-02-21 MED ORDER — PROMETHAZINE HCL 25 MG/ML IJ SOLN
INTRAMUSCULAR | Status: AC
  Administered 2021-02-21: 12.5 mg via INTRAVENOUS
  Filled 2021-02-21: qty 1

## 2021-02-21 MED ORDER — FENTANYL CITRATE (PF) 100 MCG/2ML IJ SOLN
INTRAMUSCULAR | Status: AC
  Filled 2021-02-21: qty 2

## 2021-02-21 MED ORDER — LIDOCAINE 2% (20 MG/ML) 5 ML SYRINGE
INTRAMUSCULAR | Status: DC | PRN
  Administered 2021-02-21: 60 mg via INTRAVENOUS

## 2021-02-21 MED ORDER — OXYCODONE HCL 5 MG PO TABS: 5.0000 mg | ORAL_TABLET | Freq: Once | ORAL | Status: DC | PRN

## 2021-02-21 MED ORDER — MIDAZOLAM HCL 2 MG/2ML IJ SOLN
INTRAMUSCULAR | Status: AC
  Filled 2021-02-21: qty 2

## 2021-02-21 MED ORDER — AMISULPRIDE (ANTIEMETIC) 5 MG/2ML IV SOLN: 10.0000 mg | Freq: Once | INTRAVENOUS | Status: DC | PRN

## 2021-02-21 MED ORDER — SUGAMMADEX SODIUM 200 MG/2ML IV SOLN
INTRAVENOUS | Status: DC | PRN
  Administered 2021-02-21: 200 mg via INTRAVENOUS

## 2021-02-21 SURGICAL SUPPLY — 41 items
ADAPTER BRONCHOSCOPE OLYMPUS (ADAPTER) ×2 IMPLANT
ADAPTER VALVE BIOPSY EBUS (MISCELLANEOUS) IMPLANT
ADPTR VALVE BIOPSY EBUS (MISCELLANEOUS)
BLADE CLIPPER SURG (BLADE) IMPLANT
BRUSH BIOPSY BRONCH 10 SDTNB (MISCELLANEOUS) IMPLANT
BRUSH SUPERTRAX BIOPSY (INSTRUMENTS) IMPLANT
BRUSH SUPERTRAX NDL-TIP CYTO (INSTRUMENTS) ×2 IMPLANT
CANISTER SUCT 3000ML PPV (MISCELLANEOUS) ×2 IMPLANT
CNTNR URN SCR LID CUP LEK RST (MISCELLANEOUS) ×2 IMPLANT
CONT SPEC 4OZ STRL OR WHT (MISCELLANEOUS) ×2
COVER BACK TABLE 60X90IN (DRAPES) ×2 IMPLANT
FILTER STRAW FLUID ASPIR (MISCELLANEOUS) IMPLANT
FORCEPS BIOP SUPERTRX PREMAR (INSTRUMENTS) ×2 IMPLANT
GAUZE SPONGE 4X4 12PLY STRL (GAUZE/BANDAGES/DRESSINGS) ×2 IMPLANT
GLOVE SURG SIGNA 7.5 PF LTX (GLOVE) ×2 IMPLANT
GOWN STRL REUS W/ TWL XL LVL3 (GOWN DISPOSABLE) ×1 IMPLANT
GOWN STRL REUS W/TWL XL LVL3 (GOWN DISPOSABLE) ×1
KIT CLEAN ENDO COMPLIANCE (KITS) ×2 IMPLANT
KIT ILLUMISITE 180 PROCEDURE (KITS) ×2 IMPLANT
KIT ILLUMISITE 90 PROCEDURE (KITS) IMPLANT
KIT TURNOVER KIT B (KITS) ×2 IMPLANT
MARKER SKIN DUAL TIP RULER LAB (MISCELLANEOUS) ×2 IMPLANT
NEEDLE SUPERTRX PREMARK BIOPSY (NEEDLE) ×2 IMPLANT
NS IRRIG 1000ML POUR BTL (IV SOLUTION) ×2 IMPLANT
OIL SILICONE PENTAX (PARTS (SERVICE/REPAIRS)) ×2 IMPLANT
PAD ARMBOARD 7.5X6 YLW CONV (MISCELLANEOUS) ×4 IMPLANT
PATCHES PATIENT (LABEL) ×6 IMPLANT
SYR 20ML ECCENTRIC (SYRINGE) ×2 IMPLANT
SYR 20ML LL LF (SYRINGE) ×2 IMPLANT
SYR 30ML LL (SYRINGE) ×2 IMPLANT
SYR 5ML LL (SYRINGE) ×2 IMPLANT
SYR TB 1ML LUER SLIP (SYRINGE) IMPLANT
TOWEL GREEN STERILE (TOWEL DISPOSABLE) ×2 IMPLANT
TOWEL GREEN STERILE FF (TOWEL DISPOSABLE) ×2 IMPLANT
TRAP SPECIMEN MUCUS 40CC (MISCELLANEOUS) ×2 IMPLANT
TUBE CONNECTING 20X1/4 (TUBING) ×4 IMPLANT
UNDERPAD 30X36 HEAVY ABSORB (UNDERPADS AND DIAPERS) ×2 IMPLANT
VALVE BIOPSY  SINGLE USE (MISCELLANEOUS) ×1
VALVE BIOPSY SINGLE USE (MISCELLANEOUS) ×1 IMPLANT
VALVE SUCTION BRONCHIO DISP (MISCELLANEOUS) ×2 IMPLANT
WATER STERILE IRR 1000ML POUR (IV SOLUTION) ×2 IMPLANT

## 2021-02-21 NOTE — Anesthesia Procedure Notes (Signed)
Procedure Name: Intubation Date/Time: 02/21/2021 8:01 AM Performed by: Reece Agar, CRNA Pre-anesthesia Checklist: Patient identified, Emergency Drugs available, Suction available and Patient being monitored Patient Re-evaluated:Patient Re-evaluated prior to induction Oxygen Delivery Method: Circle System Utilized Preoxygenation: Pre-oxygenation with 100% oxygen Induction Type: IV induction Ventilation: Mask ventilation without difficulty Laryngoscope Size: Mac and 4 Grade View: Grade I Tube type: Oral Tube size: 8.5 mm Number of attempts: 1 Airway Equipment and Method: Stylet and Oral airway Placement Confirmation: ETT inserted through vocal cords under direct vision, positive ETCO2 and breath sounds checked- equal and bilateral Secured at: 23 cm Tube secured with: Tape Dental Injury: Teeth and Oropharynx as per pre-operative assessment  Comments: Pt with poor dentition, remains the same as pre-operative

## 2021-02-21 NOTE — Brief Op Note (Signed)
02/21/2021  9:09 AM  PATIENT:  Steven Salinas  60 y.o. male  PRE-OPERATIVE DIAGNOSIS:  LUL NODULE  POST-OPERATIVE DIAGNOSIS:  LUL NODULE  PROCEDURE:  Procedure(s): VIDEO BRONCHOSCOPY WITH ENDOBRONCHIAL NAVIGATION WITH BIOPSIES (N/A) Needle aspirations, brushings and tranbronchial biopsies  SURGEON:  Surgeon(s) and Role:    * Loreli Slot, MD - Primary  PHYSICIAN ASSISTANT:   ASSISTANTS: none   ANESTHESIA:   general  EBL:  minimal  BLOOD ADMINISTERED:none  DRAINS: none   LOCAL MEDICATIONS USED:  NONE  SPECIMEN:  Source of Specimen:  LUL nodule  DISPOSITION OF SPECIMEN:   Path and micro  COUNTS:  NO - endo  TOURNIQUET:  * No tourniquets in log *  DICTATION: .Other Dictation: Dictation Number -  PLAN OF CARE: Discharge to home after PACU  PATIENT DISPOSITION:  PACU - hemodynamically stable.   Delay start of Pharmacological VTE agent (>24hrs) due to surgical blood loss or risk of bleeding: not applicable

## 2021-02-21 NOTE — Transfer of Care (Signed)
Immediate Anesthesia Transfer of Care Note  Patient: Steven Salinas  Procedure(s) Performed: VIDEO BRONCHOSCOPY WITH ENDOBRONCHIAL NAVIGATION WITH BIOPSIES (Chest)  Patient Location: PACU  Anesthesia Type:General  Level of Consciousness: drowsy  Airway & Oxygen Therapy: Patient Spontanous Breathing and Patient connected to face mask oxygen  Post-op Assessment: Report given to RN and Post -op Vital signs reviewed and stable  Post vital signs: Reviewed and stable  Last Vitals:  Vitals Value Taken Time  BP 132/68 02/21/21 0919  Temp 36.4 C 02/21/21 0919  Pulse 73 02/21/21 0924  Resp 11 02/21/21 0924  SpO2 99 % 02/21/21 0924  Vitals shown include unvalidated device data.  Last Pain:  Vitals:   02/21/21 0919  TempSrc:   PainSc: Asleep         Complications: No notable events documented.

## 2021-02-21 NOTE — Anesthesia Postprocedure Evaluation (Signed)
Anesthesia Post Note  Patient: Steven Salinas  Procedure(s) Performed: VIDEO BRONCHOSCOPY WITH ENDOBRONCHIAL NAVIGATION WITH BIOPSIES (Chest)     Patient location during evaluation: PACU Anesthesia Type: General Level of consciousness: awake Pain management: pain level controlled Vital Signs Assessment: post-procedure vital signs reviewed and stable Respiratory status: spontaneous breathing, nonlabored ventilation, respiratory function stable and patient connected to nasal cannula oxygen Cardiovascular status: blood pressure returned to baseline and stable Postop Assessment: no apparent nausea or vomiting Anesthetic complications: no   No notable events documented.  Last Vitals:  Vitals:   02/21/21 0934 02/21/21 0950  BP: 138/68 131/80  Pulse: (!) 102 74  Resp: 16 18  Temp:  36.8 C  SpO2: 97% 91%    Last Pain:  Vitals:   02/21/21 0950  TempSrc:   PainSc: Asleep                 Josian Lanese P Dovey Fatzinger

## 2021-02-21 NOTE — Discharge Instructions (Addendum)
Do not drive or engage in heavy physical activity for 24 hours  You may resume normal activities tomorrow  You may cough up small amounts of blood over the next few days  You may use acetaminophen (Tylenol) if needed for discomfort.  You may use an over the counter cough medication if needed.  Call [phone] if you develop chest pain, shortness of breath or cough up more than a tablespoon of blood  My office will contact you with a follow up appointment for next week to go over the results.

## 2021-02-21 NOTE — Interval H&P Note (Signed)
History and Physical Interval Note: Planning completed 02/21/2021 7:55 AM  Steven Salinas  has presented today for surgery, with the diagnosis of LUL NODULE.  The various methods of treatment have been discussed with the patient and family. After consideration of risks, benefits and other options for treatment, the patient has consented to  Procedure(s): VIDEO BRONCHOSCOPY WITH ENDOBRONCHIAL NAVIGATION (N/A) as a surgical intervention.  The patient's history has been reviewed, patient examined, no change in status, stable for surgery.  I have reviewed the patient's chart and labs.  Questions were answered to the patient's satisfaction.     Loreli Slot

## 2021-02-21 NOTE — Op Note (Signed)
NAME: Steven Salinas, Steven Salinas MEDICAL RECORD NO: [id number] ACCOUNT NO: [id number] DATE OF BIRTH: 09-Dec-1960 FACILITY: [facility] LOCATION: [facility] PHYSICIAN: Revonda Standard. Roxan Hockey, MD  Operative Report   DATE OF PROCEDURE: 02/21/2021  PREOPERATIVE DIAGNOSIS:  Left upper lobe lung nodule.  POSTOPERATIVE DIAGNOSIS:  Left upper lobe lung nodule.  PROCEDURE:  Electromagnetic navigational bronchoscopy with needle aspirations, brushings, transbronchial biopsies and bronchoalveolar lavage.  SURGEON:  Modesto Charon, MD  ASSISTANT:  None.  ANESTHESIA:  General.  FINDINGS:  No malignant cells seen on QuickPrep.  CLINICAL NOTE:  Steven Salinas is a 60 year old man with a history of tobacco abuse and COPD.  He quit smoking in 2017.  He had a low dose screening CT in 08/2020, which showed a new spiculated left upper lobe nodule.  A PET/CT later showed decrease in the  size of that nodule but a new nodule.  He has had waxing and waning nodules ever since.  Most recently, he had a CT which showed an increase in size of the newer left upper lobe nodule and was advised to undergo navigational bronchoscopy for diagnostic  purposes.  The indications, risks, benefits, and alternatives were discussed in detail with the patient.  He understood and accepted the risks and agreed to proceed.  DESCRIPTION OF PROCEDURE:  Steven Salinas was brought to the operating room on 02/21/2021.  Planning for the navigational bronchoscopy was done on the console prior to induction. He had induction of general anesthesia.  Sequential compression devices were  placed on the calves for DVT prophylaxis.  A Bair Hugger was placed for active warming.  A timeout was performed.  Flexible fiberoptic bronchoscopy was performed via the endotracheal tube, it revealed normal endobronchial anatomy with no endobronchial lesions to the level of the subsegmental bronchi.  There were thick clear secretions.   Saline was used to clear the secretions.  Locatable guide for navigation was placed.  Registration was performed.  There was good correlation of the video and virtual bronchoscopy.  The bronchoscope was directed to the left upper lobe bronchus and the  appropriate subsegmental bronchus was cannulated.  It took multiple attempts to get the appropriate bronchus cannulated.  The catheter was then advanced to within 1.3 cm of the nodule, it was towards the edge of the nodule but with good alignment.  Local  registration was performed and the catheter was repositioned to have good alignment with the nodule.  Sampling then was performed.  All sampling was done with fluoroscopy.  Needle aspirations were performed for QuickPrep and then additional aspirations  were performed for cell block and for AFB and fungal cultures.  Total of 5 needle aspirations were performed in all.  Next, multiple samples were obtained with a needle brush, total of 5 brushings were performed as well.  Next, multiple biopsies were  obtained.  Biopsies were sent for both AFB and fungal cultures as well as for permanent pathology.  QuickPrep showed no suspicious cells for malignancy.  After the biopsies were completed, bronchoalveolar lavage was performed, 100 mL of saline was  instilled and approximately 10 mL of fluid was removed.  This was combined with the washings and sent for cytology and for AFB and fungal cultures.  Final inspection was made with the bronchoscope.  There was no ongoing bleeding.  The bronchoscope was  removed.  The patient was extubated in the operating room and taken to the postanesthetic care unit in good condition.  The total fluoroscopy time was 3.3 minutes with a dose of  33 milligray.   SHW D: 02/21/2021 9:33:12 am T: 02/21/2021 10:14:00 am  JOB: [id number]/ [id number]

## 2021-02-22 ENCOUNTER — Encounter (HOSPITAL_COMMUNITY): Payer: Self-pay | Admitting: Thoracic Surgery (Cardiothoracic Vascular Surgery)

## 2021-02-23 LAB — ACID FAST SMEAR (AFB, MYCOBACTERIA): Acid Fast Smear: NEGATIVE

## 2021-02-23 LAB — CYTOLOGY - NON PAP

## 2021-02-24 LAB — SURGICAL PATHOLOGY

## 2021-03-01 ENCOUNTER — Encounter: Payer: Self-pay | Admitting: Thoracic Surgery (Cardiothoracic Vascular Surgery)

## 2021-03-01 ENCOUNTER — Ambulatory Visit (INDEPENDENT_AMBULATORY_CARE_PROVIDER_SITE_OTHER): Payer: Medicare HMO | Admitting: Thoracic Surgery (Cardiothoracic Vascular Surgery)

## 2021-03-01 ENCOUNTER — Other Ambulatory Visit: Payer: Self-pay | Admitting: Thoracic Surgery (Cardiothoracic Vascular Surgery)

## 2021-03-01 ENCOUNTER — Other Ambulatory Visit: Payer: Self-pay

## 2021-03-01 DIAGNOSIS — R918 Other nonspecific abnormal finding of lung field: Secondary | ICD-10-CM

## 2021-03-01 NOTE — Progress Notes (Signed)
[address]       [place] [address]             [phone]    HPI: Steven Salinas returns to discuss the results of his bronchoscopy.  Steven Salinas is a 60 year old man with a history of tobacco abuse, COPD, anxiety, hyperlipidemia, polyneuropathy, and chronic neck and back pain.  He quit smoking in 2017.  He had a 75-pack-year history prior to that.  In February 2022 had a low-dose screening CT which showed a new left upper lobe spiculated nodule.  I did a PET/CT which showed that nodule had decreased in size but there was a new focal left upper lobe opacity where there had been no abnormality a month prior.  That area was hypermetabolic on PET.  Findings were most consistent with infectious or inflammatory nodules.  He had a follow-up CT which showed further increase in size of that nodule.  He was advised undergo navigational bronchoscopy.  I did a navigational bronchoscopy on 02/21/2021.  Initial quick preps were negative.  He had some congestion afterwards and coughed up blood for couple of days.  The hemoptysis has resolved.  Past Medical History:  Diagnosis Date   COPD (chronic obstructive pulmonary disease) (HCC)    Generalized anxiety disorder    Hyperlipidemia    Insomnia    Pneumonia 2018   Polyneuropathy     Current Outpatient Medications  Medication Sig Dispense Refill   albuterol (VENTOLIN HFA) 108 (90 Base) MCG/ACT inhaler Inhale 2 puffs into the lungs every 6 (six) hours as needed for wheezing or shortness of breath.     ALPRAZolam (XANAX) 1 MG tablet Take 1 mg by mouth See admin instructions. Take 1 mg at bedtime, may take a second 1 mg dose during the day as needed for anxiety     ASPIRIN LOW DOSE 81 MG EC tablet Take 81 mg by mouth daily.     atorvastatin (LIPITOR) 10 MG tablet Take 10 mg by mouth daily.     buPROPion (WELLBUTRIN XL) 300 MG 24 hr tablet Take 300 mg by mouth daily.     cyclobenzaprine (FLEXERIL) 10 MG tablet Take 10 mg by  mouth 3 (three) times daily as needed for muscle spasms.     Naphazoline HCl (CLEAR EYES OP) Place 1 drop into both eyes daily as needed (irritation).     naproxen sodium (ALEVE) 220 MG tablet Take 220 mg by mouth daily as needed (headaches).     promethazine (PHENERGAN) 25 MG tablet Take 25 mg by mouth every 6 (six) hours as needed for nausea or vomiting.     SYMBICORT 160-4.5 MCG/ACT inhaler Inhale 2 puffs into the lungs 2 (two) times daily.     No current facility-administered medications for this visit.    Physical Exam BP (!) 153/74 (BP Location: Right Arm, Patient Position: Sitting, Cuff Size: Normal)   Pulse 87   Resp 20   Ht 5\' 10"  (1.778 m)   Wt 162 lb 6.4 oz (73.7 kg)   SpO2 93% Comment: RA  BMI 23.68 kg/m  59 year old man in no acute distress Alert and oriented x3 with no focal deficits, anxious Lungs clear  Diagnostic Tests: FINAL MICROSCOPIC DIAGNOSIS:   A. LUNG, LEFT UPPER LOBE, BIOPSY:  - Focal noncaseating granuloma.  - No malignancy identified.  FINAL MICROSCOPIC DIAGNOSIS:   A. LUNG, LUL, FINE NEEDLE ASPIRATION:  - No malignant cells identified   B. LUNG, LUL, BRUSHING:  -  No malignant cells identified  - Very scant granulomatous like fragment.   SPECIMEN ADEQUACY:   A. Satisfactory for Evaluation  B.  Satisfactory for Evaluation   Impression: Steven Salinas is a 60 year old man with a history of tobacco abuse, COPD, anxiety, hyperlipidemia, polyneuropathy, and chronic neck and back pain.  He quit smoking in 2017.  He had a 75-pack-year history prior to that.  In February 2022 had a low-dose screening CT which showed a new left upper lobe spiculated nodule.  A PET/CT showed a decrease in the size of that nodule but there was a second new area adjacent to a bleb in the left upper lobe.  It was hypermetabolic.  A follow-up CT showed further increase in size of that area and he was advised undergo navigational bronchoscopy.  Navigational bronchoscopy was  done with needle aspirations brushings and transbronchial biopsies.  All the specimens were negative for malignancy.  There was evidence of some granulomatous disease on the brushings and biopsies.  AFB and fungal stains were negative.  Cultures are in progress.  I discussed these results with Steven Salinas.  In all likelihood, this is granulomatous disease.  That would make much more sense with the time course of waxing and waning of the nodules.  He does understand that we cannot completely rule out malignancy based on a single bronchoscopy result.  I recommended that we repeat a CT in 6 weeks that will be about 8 weeks from the last CT.  That will also give [place] time to get the results back from the AFB and fungal cultures.  Plan: Return in 6 weeks with CT chest  Loreli Slot, MD [facility] [phone]

## 2021-04-01 LAB — FUNGUS CULTURE RESULT

## 2021-04-01 LAB — FUNGUS CULTURE WITH STAIN

## 2021-04-01 LAB — FUNGAL ORGANISM REFLEX

## 2021-04-05 LAB — ACID FAST SMEAR (AFB, MYCOBACTERIA): Acid Fast Smear: NEGATIVE

## 2021-04-05 LAB — FUNGAL ORGANISM REFLEX

## 2021-04-05 LAB — FUNGUS CULTURE WITH STAIN

## 2021-04-05 LAB — FUNGUS CULTURE RESULT

## 2021-04-06 LAB — MAC SUSCEPTIBILITY BROTH
Ciprofloxacin: >8
Clarithromycin: 2
Moxifloxacin: 0.5
Rifampin: 4
Streptomycin: 16

## 2021-04-06 LAB — AFB ORGANISM ID BY DNA PROBE
M avium complex: POSITIVE — AB
M tuberculosis complex: NEGATIVE

## 2021-04-06 LAB — ACID FAST CULTURE WITH REFLEXED SENSITIVITIES (MYCOBACTERIA): Acid Fast Culture: POSITIVE — AB

## 2021-04-07 LAB — ACID FAST CULTURE WITH REFLEXED SENSITIVITIES (MYCOBACTERIA): Acid Fast Culture: NEGATIVE

## 2021-04-11 ENCOUNTER — Ambulatory Visit (INDEPENDENT_AMBULATORY_CARE_PROVIDER_SITE_OTHER): Payer: Medicare HMO | Admitting: Internal Medicine

## 2021-04-11 ENCOUNTER — Other Ambulatory Visit: Payer: Self-pay

## 2021-04-11 ENCOUNTER — Encounter: Payer: Self-pay | Admitting: Internal Medicine

## 2021-04-11 DIAGNOSIS — A310 Pulmonary mycobacterial infection: Secondary | ICD-10-CM | POA: Diagnosis not present

## 2021-04-11 LAB — HEPATIC FUNCTION PANEL
AG Ratio: 1.4 (calc) (ref 1.0–2.5)
ALT: 12 U/L (ref 9–46)
AST: 15 U/L (ref 10–35)
Albumin: 3.9 g/dL (ref 3.6–5.1)
Alkaline phosphatase (APISO): 117 U/L (ref 35–144)
Bilirubin, Direct: 0.1 mg/dL (ref 0.0–0.2)
Globulin: 2.8 g/dL (calc) (ref 1.9–3.7)
Indirect Bilirubin: 0.1 mg/dL (calc) — ABNORMAL LOW (ref 0.2–1.2)
Total Bilirubin: 0.2 mg/dL (ref 0.2–1.2)
Total Protein: 6.7 g/dL (ref 6.1–8.1)

## 2021-04-11 LAB — CBC
HCT: 44 % (ref 38.5–50.0)
Hemoglobin: 13.9 g/dL (ref 13.2–17.1)
MCH: 29 pg (ref 27.0–33.0)
MCHC: 31.6 g/dL — ABNORMAL LOW (ref 32.0–36.0)
MCV: 91.9 fL (ref 80.0–100.0)
MPV: 9.4 fL (ref 7.5–12.5)
Platelets: 449 10*3/uL — ABNORMAL HIGH (ref 140–400)
RBC: 4.79 10*6/uL (ref 4.20–5.80)
RDW: 12.7 % (ref 11.0–15.0)
WBC: 11.8 10*3/uL — ABNORMAL HIGH (ref 3.8–10.8)

## 2021-04-11 MED ORDER — RIFAMPIN 300 MG PO CAPS: 600.0000 mg | ORAL_CAPSULE | ORAL | 5 refills | Status: DC

## 2021-04-11 MED ORDER — ETHAMBUTOL HCL 400 MG PO TABS: 1600.0000 mg | ORAL_TABLET | ORAL | 5 refills | Status: DC

## 2021-04-11 MED ORDER — AZITHROMYCIN 500 MG PO TABS: 500.0000 mg | ORAL_TABLET | ORAL | 5 refills | Status: DC

## 2021-04-11 MED ORDER — ETHAMBUTOL HCL 400 MG PO TABS: 15.0000 mg/kg | ORAL_TABLET | ORAL | 5 refills | Status: DC

## 2021-04-11 NOTE — Progress Notes (Signed)
Met with patient today to discuss MAC treatment therapy. Advised to take azithromycin 500 mg (one tablet) 3 times per week with or without food; ethambutol 1600 mg (4 tablets) 3 times per week with or without food; and rifampin 600 mg (2 capsules) 3 times per week WITHOUT food (1 hr before or 2 hours after a meal). Cautioned about possible adverse effects such as nausea and diarrhea. Also advised that rifampin could cause secretions to be a reddish color and to not be alarmed as it isn't permanent or damaging. Discussed ways to take all three medications (all on same day; rotating). Patient would prefer to take all three together on the same day so that he remembers. He will take Phenergan ~30 minutes prior to administration. He will call me if he cannot tolerate, and we will come up with another plan, if needed. Answered all questions and provided my number if he needs it.

## 2021-04-11 NOTE — Progress Notes (Signed)
[facility]      Reason for Consult: MAC pulmonary infection   Referring Physician: Dr. Roxan Hockey    Patient ID: Steven Salinas, male    DOB: 07/01/1961, 60 y.o.   MRN: [id number]  HPI:   Here for evaluation of a lung opacity and nodules.   He has a history of 75 pack years of smoking and quit in 2017.  He established with a new primary care provider who had him do a screening CT for lung cancer screening and noted a left upper lobe spiculated nodule.  He was sent to Dr. Roxan Hockey of [facility] and did a PET scan then a navigational bronchoscopy on 02/21/21.  Findings were most c/w a granulomatous infection and culture now positive for MAI.  He has remained largely asymptomatic with some productive cough he experiences, no fever, no weight loss and some dyspnea on exertion.  No history of this previously.   Past Medical History:  Diagnosis Date   COPD (chronic obstructive pulmonary disease) ([place])    Generalized anxiety disorder    Hyperlipidemia    Insomnia    Pneumonia 2018   Polyneuropathy     Prior to Admission medications   Medication Sig Start Date End Date Taking? Authorizing Provider  albuterol (VENTOLIN HFA) 108 (90 Base) MCG/ACT inhaler Inhale 2 puffs into the lungs every 6 (six) hours as needed for wheezing or shortness of breath.    [provider]  ALPRAZolam Duanne Moron) 1 MG tablet Take 1 mg by mouth See admin instructions. Take 1 mg at bedtime, may take a second 1 mg dose during the day as needed for anxiety    [provider]  ASPIRIN LOW DOSE 81 MG EC tablet Take 81 mg by mouth daily. 09/21/20   [provider]  atorvastatin (LIPITOR) 10 MG tablet Take 10 mg by mouth daily.    [provider]  buPROPion (WELLBUTRIN XL) 300 MG 24 hr tablet Take 300 mg by mouth daily. 09/21/20   [provider]  cyclobenzaprine (FLEXERIL) 10 MG tablet Take 10 mg by mouth 3 (three) times daily as needed for muscle spasms.     [provider]  Naphazoline HCl (CLEAR EYES OP) Place 1 drop into both eyes daily as needed (irritation).    [provider]  naproxen sodium (ALEVE) 220 MG tablet Take 220 mg by mouth daily as needed (headaches).    [provider]  promethazine (PHENERGAN) 25 MG tablet Take 25 mg by mouth every 6 (six) hours as needed for nausea or vomiting.    [provider]  SYMBICORT 160-4.5 MCG/ACT inhaler Inhale 2 puffs into the lungs 2 (two) times daily. 09/22/20   [provider]    Allergies  Allergen Reactions   Codeine     Unknown reaction     Social History   Tobacco Use   Smoking status: Former    Packs/day: 1.00    Types: Cigarettes    Quit date: 11/29/2015    Years since quitting: 5.3   Smokeless tobacco: Never  Vaping Use   Vaping Use: Never used  Substance Use Topics   Alcohol use: No    Comment: "very seldom"   Drug use: Yes    Types: Marijuana    Comment: weekly    Family History  Problem Relation Age of Onset   Heart disease Mother    Rheum arthritis Mother    Heart disease Father    Diabetes  Father     Review of Systems  Constitutional: negative for fevers and chills Respiratory: positive for cough, sputum, or dyspnea on exertion, negative for hemoptysis Gastrointestinal: negative for diarrhea All other systems reviewed and are negative    Constitutional: in no apparent distress There were no vitals filed for this visit. EYES: anicteric ENMT: no thrush Cardiovascular: Cor RRR Respiratory: distant, no wheezes, no crackles GI: Bowel sounds are normal, liver is not enlarged, spleen is not enlarged Musculoskeletal: no pedal edema noted Skin: negatives: no rash Neuro: non-focal  Labs: Lab Results  Component Value Date   WBC 11.9 (H) 02/17/2021   HGB 13.8 02/17/2021   HCT 42.5 02/17/2021   MCV 91.0 02/17/2021   PLT 454 (H) 02/17/2021    Lab Results  Component Value Date   CREATININE 1.01 02/17/2021   BUN  23 (H) 02/17/2021   NA 136 02/17/2021   K 4.0 02/17/2021   CL 104 02/17/2021   CO2 25 02/17/2021    Lab Results  Component Value Date   ALT 18 02/17/2021   AST 19 02/17/2021   ALKPHOS 115 02/17/2021   BILITOT 0.5 02/17/2021   INR 1.0 02/17/2021     Assessment: MAI infection, nodular/non-cavitary.  I discussed the nature of the infection, slow growing, difficulty in treatment and treatment regimen.  I also discussed the side effects as did our pharmacist.  He elected to proceed with treatment.    Plan: 1)  rifampin, ethambutol and azithromycin three times a week 2) baseline hepatic panel and CBC Follow up in 4 weeks to repeat hepatic function panel and evaluate tolerance of the medications

## 2021-04-11 NOTE — Patient Instructions (Addendum)
Azithromycin 500 mg (ONE tablet) 3 times per week - with or without food Ethambutol 1600 mg (FOUR tablets) 3 times per week - with or without food Rifampin 600 mg (TWO capsules) 3 times per week - WITHOUT food (1 hour before or 2 hours after a meal)  Blia Totman, Pharmacist [phone]

## 2021-04-12 ENCOUNTER — Ambulatory Visit
Admission: RE | Admit: 2021-04-12 | Discharge: 2021-04-12 | Disposition: A | Discharge status: Home / Self Care | Payer: Medicare HMO | Source: Ambulatory Visit | Attending: Thoracic Surgery (Cardiothoracic Vascular Surgery) | Admitting: Thoracic Surgery (Cardiothoracic Vascular Surgery)

## 2021-04-12 ENCOUNTER — Other Ambulatory Visit: Payer: Self-pay

## 2021-04-12 ENCOUNTER — Ambulatory Visit (INDEPENDENT_AMBULATORY_CARE_PROVIDER_SITE_OTHER): Payer: Medicare HMO | Admitting: Thoracic Surgery (Cardiothoracic Vascular Surgery)

## 2021-04-12 VITALS — BP 117/67 | HR 93 | Resp 20 | Ht 70.0 in | Wt 157.0 lb

## 2021-04-12 DIAGNOSIS — R918 Other nonspecific abnormal finding of lung field: Secondary | ICD-10-CM

## 2021-04-12 NOTE — Progress Notes (Signed)
[address]       [place] [address]             [phone]     HPI: Steven Salinas returns for a scheduled follow-up regarding lung nodules.  Steven Salinas is a 60 year old man with a history of tobacco abuse, COPD, anxiety, hyperlipidemia, polyneuropathy, chronic neck and back pain, and waxing and waning lung nodules.  In February 2022 had a low-dose screening CT which showed a spiculated left upper lobe nodule.  On PET/CT the nodule had decreased in size but there was a new opacity nearby in an area that have been normal previously.  That area was hypermetabolic.  On follow-up CT that area had increased further in size so he underwent navigational bronchoscopy.  Pathology showed some noncaseating granulomas.  No malignant cells were identified.  Initial AFB stains were negative but he subsequently grew out MAI.  He has been feeling well.  He just saw Dr. Luciana Salinas and got started on antibiotics yesterday.  Past Medical History:  Diagnosis Date   COPD (chronic obstructive pulmonary disease) (HCC)    Generalized anxiety disorder    Hyperlipidemia    Insomnia    Pneumonia 2018   Polyneuropathy     Current Outpatient Medications  Medication Sig Dispense Refill   albuterol (VENTOLIN HFA) 108 (90 Base) MCG/ACT inhaler Inhale 2 puffs into the lungs every 6 (six) hours as needed for wheezing or shortness of breath.     ALPRAZolam (XANAX) 1 MG tablet Take 1 mg by mouth See admin instructions. Take 1 mg at bedtime, may take a second 1 mg dose during the day as needed for anxiety     ASPIRIN LOW DOSE 81 MG EC tablet Take 81 mg by mouth daily.     atorvastatin (LIPITOR) 10 MG tablet Take 10 mg by mouth daily.     azithromycin (ZITHROMAX) 500 MG tablet Take 1 tablet (500 mg total) by mouth 3 (three) times a week. 12 tablet 5   buPROPion (WELLBUTRIN XL) 300 MG 24 hr tablet Take 300 mg by mouth daily.     cyclobenzaprine (FLEXERIL) 10 MG tablet Take 10 mg by mouth 3 (three)  times daily as needed for muscle spasms.     ethambutol (MYAMBUTOL) 400 MG tablet Take 4 tablets (1,600 mg total) by mouth 3 (three) times a week. 12 tablet 5   Naphazoline HCl (CLEAR EYES OP) Place 1 drop into both eyes daily as needed (irritation).     naproxen sodium (ALEVE) 220 MG tablet Take 220 mg by mouth daily as needed (headaches).     promethazine (PHENERGAN) 25 MG tablet Take 25 mg by mouth every 6 (six) hours as needed for nausea or vomiting.     rifampin (RIFADIN) 300 MG capsule Take 2 capsules (600 mg total) by mouth 3 (three) times a week. 24 capsule 5   SYMBICORT 160-4.5 MCG/ACT inhaler Inhale 2 puffs into the lungs 2 (two) times daily.     No current facility-administered medications for this visit.    Physical Exam BP 117/67 (BP Location: Right Arm)   Pulse 93   Resp 20   Ht 5\' 10"  (1.778 m)   Wt 157 lb (71.2 kg)   SpO2 93% Comment: RA  BMI 22.18 kg/m  60 year old man in no acute distress Alert and oriented x3 with no focal deficits Lungs diminished breath sounds bilaterally no wheezing Cardiac regular rate and rhythm  Diagnostic Tests: CT CHEST WITHOUT  CONTRAST   TECHNIQUE: Multidetector CT imaging of the chest was performed following the standard protocol without IV contrast.   COMPARISON:  01/25/2021   FINDINGS: Cardiovascular: The heart size is normal. No substantial pericardial effusion. Coronary artery calcification is evident. Mild atherosclerotic calcification is noted in the wall of the thoracic aorta.   Mediastinum/Nodes: No mediastinal lymphadenopathy. No evidence for gross hilar lymphadenopathy although assessment is limited by the lack of intravenous contrast on the current study. The esophagus has normal imaging features. There is no axillary lymphadenopathy.   Lungs/Pleura: Centrilobular and paraseptal emphysema evident. 3.4 x 1.7 cm irregular nodular opacity seen in the left upper lobe previously has decreased in the interval measuring  2.1 x 0.9 cm today.   Additional tiny left upper lobe nodules measured previously are stable at 3 mm today (49/8) and 3 mm on 57/8. 5 mm irregular nodular opacity posterior left upper lobe on 50/8 is unchanged.   A new irregular nodular opacities identified in the posterior right apex on 25/8 measuring 1.2 x 0.9 cm. 4 mm posterior right middle lobe nodule on 76/8 is unchanged.   No pleural effusion.   Upper Abdomen: Unremarkable.   Musculoskeletal: 3.3 cm subcutaneous lesion right flank region is probably a sebaceous cyst. No worrisome lytic or sclerotic osseous abnormality.   IMPRESSION: 1. Interval decrease in size of the dominant irregular nodular opacity in the left upper lobe. 2. New irregular nodular opacity in the posterior right apex measuring up to 1.2 cm. This is likely infectious/inflammatory, but close follow-up recommended. Consider follow-up CT chest in 3 months. 3. Other scattered bilateral pulmonary nodules are stable in the interval. 4. Aortic Atherosclerosis (ICD10-I70.0) and Emphysema (ICD10-J43.9).     Electronically Signed   By: Kennith Center M.D.   On: 04/12/2021 12:34 I personally reviewed the CT images.  There is a new right upper lobe nodule.  Decrease in size of the left upper lobe nodule.  Impression: Steven Salinas is a 60 year old former smoker with a past history significant for  Steven Salinas is a 60 year old man with a history of tobacco abuse, COPD, anxiety, hyperlipidemia, polyneuropathy, chronic neck and back pain, and waxing and waning lung nodules.  He had a navigational bronchoscopy for an enlarging left upper lobe nodule.  Biopsy showed granulomas.  Cultures ultimately grew out MAI.  He saw Dr. Luciana Salinas yesterday and was started on azithromycin, ethambutol, and rifampin.  He will need a long course of antibiotics.  CT today shows a decrease in size of the left upper lobe nodule but there is a new right upper lobe nodule that almost surely  is the same thing.  However, given his smoking history we need to ensure that is the case.  We will plan to do another scan in 3 months.  Plan: Antibiotics per Dr. Luciana Salinas Return in 3 months with CT chest  Loreli Slot, MD [facility] [phone]

## 2021-04-20 ENCOUNTER — Other Ambulatory Visit: Payer: Self-pay | Admitting: Pharmacist

## 2021-04-20 DIAGNOSIS — A310 Pulmonary mycobacterial infection: Secondary | ICD-10-CM

## 2021-04-20 MED ORDER — ETHAMBUTOL HCL 400 MG PO TABS: 1600.0000 mg | ORAL_TABLET | ORAL | 5 refills | Status: DC

## 2021-05-09 ENCOUNTER — Ambulatory Visit (INDEPENDENT_AMBULATORY_CARE_PROVIDER_SITE_OTHER): Payer: Medicare HMO | Admitting: Internal Medicine

## 2021-05-09 ENCOUNTER — Encounter: Payer: Self-pay | Admitting: Internal Medicine

## 2021-05-09 ENCOUNTER — Other Ambulatory Visit: Payer: Self-pay

## 2021-05-09 VITALS — BP 145/81 | HR 76 | Temp 97.8°F | Ht 70.0 in | Wt 160.0 lb

## 2021-05-09 DIAGNOSIS — R918 Other nonspecific abnormal finding of lung field: Secondary | ICD-10-CM | POA: Diagnosis not present

## 2021-05-09 DIAGNOSIS — A310 Pulmonary mycobacterial infection: Secondary | ICD-10-CM

## 2021-05-09 DIAGNOSIS — Z5181 Encounter for therapeutic drug level monitoring: Secondary | ICD-10-CM | POA: Diagnosis not present

## 2021-05-09 NOTE — Progress Notes (Signed)
   Subjective:    Patient ID: Steven Salinas, male    DOB: 10/21/1960, 60 y.o.   MRN: [id number]  HPI Here for follow up of MAI infection.  He has a long history of smoking and a screening CT chest noted a left upper lobe spiculated nodule and went to see Dr. Dorris Fetch who did a PET scan.  He underwent a navigational bronchoscopy in July and culture positive for MAI infection.  I started him on 3 drug regimen with ethambutol, rifampin and azitrhomycin and he is tolerating these well.  No associated abdominal issues, no rash, no diarrhea.  He has continued DOE.  No fever.  No significant cough. Follow up CT noted a decrease in th eleft upper lobe nodule and a new right upper lobe nodule.  Plan for another CT in about 2 months to monitor the new finding.  He noted red urine from rifampin.  No eye issues.     Review of Systems  Constitutional:  Negative for fatigue, fever and unexpected weight change.  Respiratory:  Negative for cough, shortness of breath and wheezing.       Objective:   Physical Exam Eyes:     General: No scleral icterus. Abdominal:     General: Abdomen is flat.     Palpations: Abdomen is soft.  Neurological:     General: No focal deficit present.     Mental Status: He is alert.  Psychiatric:        Mood and Affect: Mood normal.   Sh: no tobacco - quit in 2017       Assessment & Plan:

## 2021-05-09 NOTE — Assessment & Plan Note (Signed)
New nodule noted on last CT and will be rechecked in about 2 months.  Will continue to monitor.

## 2021-05-09 NOTE — Assessment & Plan Note (Signed)
Will check his LFTs today and if ok, he can return in 2-3 months. He would like to come the same day he sees Dr. Dorris Fetch to reduce his travel.

## 2021-05-09 NOTE — Assessment & Plan Note (Signed)
He is doing well with his 3 x per week regimen, taking all the medications on the same day.  No significant improvement in symptoms but I would not expect much improvement, if any, for several months.  He understands it is a 60-80% chance of eradicating the infection.  Will continue with the medications

## 2021-05-10 LAB — HEPATIC FUNCTION PANEL
AG Ratio: 1.5 (calc) (ref 1.0–2.5)
ALT: 16 U/L (ref 9–46)
AST: 18 U/L (ref 10–35)
Albumin: 4.1 g/dL (ref 3.6–5.1)
Alkaline phosphatase (APISO): 100 U/L (ref 35–144)
Bilirubin, Direct: 0.1 mg/dL (ref 0.0–0.2)
Globulin: 2.8 g/dL (calc) (ref 1.9–3.7)
Indirect Bilirubin: 0.1 mg/dL (calc) — ABNORMAL LOW (ref 0.2–1.2)
Total Bilirubin: 0.2 mg/dL (ref 0.2–1.2)
Total Protein: 6.9 g/dL (ref 6.1–8.1)

## 2021-06-13 ENCOUNTER — Other Ambulatory Visit: Payer: Self-pay | Admitting: Thoracic Surgery (Cardiothoracic Vascular Surgery)

## 2021-06-13 DIAGNOSIS — R918 Other nonspecific abnormal finding of lung field: Secondary | ICD-10-CM

## 2021-06-17 ENCOUNTER — Telehealth: Payer: Self-pay

## 2021-06-17 ENCOUNTER — Other Ambulatory Visit: Payer: Self-pay | Admitting: Pharmacist

## 2021-06-17 DIAGNOSIS — A310 Pulmonary mycobacterial infection: Secondary | ICD-10-CM

## 2021-06-17 MED ORDER — RIFAMPIN 300 MG PO CAPS: 600.0000 mg | ORAL_CAPSULE | ORAL | 5 refills | Status: DC

## 2021-06-17 MED ORDER — AZITHROMYCIN 500 MG PO TABS: 500.0000 mg | ORAL_TABLET | ORAL | 5 refills | Status: DC

## 2021-06-17 MED ORDER — ETHAMBUTOL HCL 400 MG PO TABS: 1600.0000 mg | ORAL_TABLET | ORAL | 5 refills | Status: DC

## 2021-06-17 NOTE — Telephone Encounter (Signed)
Sent - thank you

## 2021-06-17 NOTE — Telephone Encounter (Signed)
Received fax from [facility] requesting that patient's rifampin be transferred there. Appears that patient is also on azithromycin and ethambutol. Forwarding to pharmacy.   [facility] P: [phone] F: [phone]  Sandie Ano, RN

## 2021-07-06 ENCOUNTER — Ambulatory Visit: Payer: Medicare HMO | Admitting: Internal Medicine

## 2021-07-11 ENCOUNTER — Ambulatory Visit: Payer: Medicare HMO | Admitting: Internal Medicine

## 2021-07-19 ENCOUNTER — Ambulatory Visit: Payer: Medicare HMO | Admitting: Thoracic Surgery (Cardiothoracic Vascular Surgery)

## 2021-07-27 ENCOUNTER — Ambulatory Visit: Payer: Medicare HMO | Admitting: Internal Medicine

## 2021-07-28 ENCOUNTER — Other Ambulatory Visit: Payer: Self-pay

## 2021-07-28 ENCOUNTER — Encounter: Payer: Self-pay | Admitting: Internal Medicine

## 2021-07-28 ENCOUNTER — Ambulatory Visit (INDEPENDENT_AMBULATORY_CARE_PROVIDER_SITE_OTHER): Payer: Medicare HMO | Admitting: Internal Medicine

## 2021-07-28 VITALS — BP 153/77 | HR 92 | Temp 98.1°F | Wt 160.0 lb

## 2021-07-28 DIAGNOSIS — R918 Other nonspecific abnormal finding of lung field: Secondary | ICD-10-CM

## 2021-07-28 DIAGNOSIS — A310 Pulmonary mycobacterial infection: Secondary | ICD-10-CM | POA: Diagnosis not present

## 2021-07-28 DIAGNOSIS — Z5181 Encounter for therapeutic drug level monitoring: Secondary | ICD-10-CM

## 2021-07-28 NOTE — Assessment & Plan Note (Signed)
Likely from the MAI and repeat CT scan next week to monitor.

## 2021-07-28 NOTE — Progress Notes (Signed)
° °  Subjective:    Patient ID: Steven Salinas, male    DOB: 14-Jul-1961, 60 y.o.   MRN: [id number]  HPI Here for follow up of MAI infection.  He has a long history of smoking and a screening CT chest noted a left upper lobe spiculated nodule and went to see Dr. Dorris Fetch who did a PET scan.  He underwent a navigational bronchoscopy in July and culture positive for MAI infection.  I started him on 3 drug regimen with ethambutol, rifampin and azitrhomycin and he is tolerating these well.  Follow up CT noted a decrease in the left upper lobe nodule and a new right lobe nodule.   He is tolerating his 3 drug regimen well and no complaints today.  His DOE is about the same, no fever, no rash, no vision changes or concerns. He has a CT scan scheduled by Dr. Dorris Fetch for next week.    Review of Systems  Constitutional:  Negative for chills and fever.  Respiratory:  Negative for chest tightness, shortness of breath and wheezing.       Objective:   Physical Exam Constitutional:      Appearance: Normal appearance.  Eyes:     General: No scleral icterus. Pulmonary:     Effort: Pulmonary effort is normal. No respiratory distress.     Breath sounds: No wheezing.  Skin:    Findings: No rash.  Neurological:     General: No focal deficit present.  Psychiatric:        Judgment: Judgment normal.   SH: remains tobacco free       Assessment & Plan:

## 2021-07-28 NOTE — Assessment & Plan Note (Signed)
He is tolerating the medication well and will continue with the same regimen.  Will see how the repeat CT scan looks next week and hopefully no new areas of concern and some regression.  No changes indicated.

## 2021-07-28 NOTE — Assessment & Plan Note (Signed)
I will check his hepatic function panel on this regimen.   He otherwise is reporting no significant side effects or concerns and will to continue taking his medications, hoping for some stabilization.

## 2021-07-29 LAB — HEPATIC FUNCTION PANEL
AG Ratio: 1.5 (calc) (ref 1.0–2.5)
ALT: 15 U/L (ref 9–46)
AST: 15 U/L (ref 10–35)
Albumin: 4.3 g/dL (ref 3.6–5.1)
Alkaline phosphatase (APISO): 117 U/L (ref 35–144)
Bilirubin, Direct: 0.1 mg/dL (ref 0.0–0.2)
Globulin: 2.9 g/dL (calc) (ref 1.9–3.7)
Indirect Bilirubin: 0.1 mg/dL (calc) — ABNORMAL LOW (ref 0.2–1.2)
Total Bilirubin: 0.2 mg/dL (ref 0.2–1.2)
Total Protein: 7.2 g/dL (ref 6.1–8.1)

## 2021-08-02 ENCOUNTER — Ambulatory Visit (INDEPENDENT_AMBULATORY_CARE_PROVIDER_SITE_OTHER): Payer: Medicare HMO | Admitting: Thoracic Surgery (Cardiothoracic Vascular Surgery)

## 2021-08-02 ENCOUNTER — Ambulatory Visit
Admission: RE | Admit: 2021-08-02 | Discharge: 2021-08-02 | Disposition: A | Discharge status: Home / Self Care | Payer: Medicare HMO | Source: Ambulatory Visit | Attending: Thoracic Surgery (Cardiothoracic Vascular Surgery) | Admitting: Thoracic Surgery (Cardiothoracic Vascular Surgery)

## 2021-08-02 ENCOUNTER — Other Ambulatory Visit: Payer: Self-pay

## 2021-08-02 ENCOUNTER — Encounter: Payer: Self-pay | Admitting: Thoracic Surgery (Cardiothoracic Vascular Surgery)

## 2021-08-02 VITALS — BP 110/62 | HR 87 | Resp 20 | Ht 70.0 in | Wt 160.0 lb

## 2021-08-02 DIAGNOSIS — R918 Other nonspecific abnormal finding of lung field: Secondary | ICD-10-CM

## 2021-08-02 NOTE — Progress Notes (Signed)
[address]       [place] [address]             [phone]     HPI: [name] returns for scheduled follow-up visit  Steven Salinas is a 61 year old former smoker with a history of COPD, anxiety, hyperlipidemia, polyneuropathy, chronic neck and back pain, and waxing and waning lung nodules.  He was found to have a spiculated left upper lobe lung nodule on a low-dose screening CT in February 2022.  On PET/CT the nodule had decreased in size, but there was a new nodule that was hypermetabolic.  On a follow-up CT that area had further increased in size.  I did a navigational bronchoscopy.  Pathology showed noncaseating granulomas.  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  He currently is being treated for that by Dr. Luciana Salinas.  He saw him last week.  He is not having any fevers, chills, or sweats.  He does have wheezing and uses his albuterol inhaler relatively often.  Past Medical History:  Diagnosis Date   COPD (chronic obstructive pulmonary disease) (HCC)    Generalized anxiety disorder    Hyperlipidemia    Insomnia    Pneumonia 2018   Polyneuropathy      Current Outpatient Medications  Medication Sig Dispense Refill   albuterol (VENTOLIN HFA) 108 (90 Base) MCG/ACT inhaler Inhale 2 puffs into the lungs every 6 (six) hours as needed for wheezing or shortness of breath.     ALPRAZolam (XANAX) 1 MG tablet Take 1 mg by mouth See admin instructions. Take 1 mg at bedtime, may take a second 1 mg dose during the day as needed for anxiety     ASPIRIN LOW DOSE 81 MG EC tablet Take 81 mg by mouth daily.     atorvastatin (LIPITOR) 10 MG tablet Take 10 mg by mouth daily.     azithromycin (ZITHROMAX) 500 MG tablet Take 1 tablet (500 mg total) by mouth 3 (three) times a week. 12 tablet 5   buPROPion (WELLBUTRIN XL) 300 MG 24 hr tablet Take 300 mg by mouth daily.     cyclobenzaprine (FLEXERIL) 10 MG tablet Take 10 mg by mouth 3 (three) times daily as needed  for muscle spasms.     ethambutol (MYAMBUTOL) 400 MG tablet Take 4 tablets (1,600 mg total) by mouth 3 (three) times a week. 42 tablet 5   gabapentin (NEURONTIN) 300 MG capsule Take 300 mg by mouth daily.     HYDROcodone-acetaminophen (NORCO) 10-325 MG tablet Take 1 tablet by mouth every 4 (four) hours as needed.     Naphazoline HCl (CLEAR EYES OP) Place 1 drop into both eyes daily as needed (irritation).     naproxen sodium (ALEVE) 220 MG tablet Take 220 mg by mouth daily as needed (headaches).     promethazine (PHENERGAN) 25 MG tablet Take 25 mg by mouth every 6 (six) hours as needed for nausea or vomiting.     rifampin (RIFADIN) 300 MG capsule Take 2 capsules (600 mg total) by mouth 3 (three) times a week. 24 capsule 5   SYMBICORT 160-4.5 MCG/ACT inhaler Inhale 2 puffs into the lungs 2 (two) times daily.     No current facility-administered medications for this visit.    Physical Exam BP 110/62 (BP Location: Left Arm, Patient Position: Sitting, Cuff Size: Normal)    Pulse 87    Resp 20    Ht 5\' 10"  (1.778 m)  Wt 160 lb (72.6 kg)    SpO2 93% Comment: RA   BMI 22.78 kg/m  61 year old man in no acute distress Alert and oriented x3 with no focal deficits Lungs faint wheezing bilaterally Cardiac regular rate and rhythm  Diagnostic Tests: CT CHEST WITHOUT CONTRAST   TECHNIQUE: Multidetector CT imaging of the chest was performed following the standard protocol without IV contrast.   COMPARISON:  04/12/2021   FINDINGS: Cardiovascular: The heart size is normal. No substantial pericardial effusion. Coronary artery calcification is evident. Mild atherosclerotic calcification is noted in the wall of the thoracic aorta.   Mediastinum/Nodes: No mediastinal lymphadenopathy. No evidence for gross hilar lymphadenopathy although assessment is limited by the lack of intravenous contrast on the current study. The esophagus has normal imaging features. There is no axillary lymphadenopathy.    Lungs/Pleura: Centrilobular and paraseptal emphysema evident.   Interval evolution of the irregular posterior right apical nodular density with interval development of patchy irregular peripheral consolidative opacity inferior to the lesion today creating a more irregular plaque-like appearance.   2.1 x 0.9 cm nodular lesion in the left upper lobe seen previously is less confluent with areas of ground-glass opacity today, measuring 2.0 x 0.6 cm.   3-4 mm posterior right upper lobe nodule with a suggestion of adjacent ground-glass opacity (76/8) is stable.   7 mm right upper lobe nodule on 82/8 is unchanged.   5 mm posterior left upper lobe nodule on 51/8 is stable.   No new areas of focal airspace consolidation. No pulmonary edema or pleural effusion.   Upper Abdomen: Tiny exophytic lesion upper pole left kidney is too small to characterize but similar to prior likely benign.   Musculoskeletal: No worrisome lytic or sclerotic osseous abnormality.   IMPRESSION: 1. Interval evolution of the irregular posterior right apical nodular density with interval development of patchy irregular peripheral consolidative opacity inferior to the lesion today, creating an irregular plaque-like appearance. Imaging features suggest infectious/inflammatory etiology although neoplasm cannot be excluded. Continued close attention recommended and PET-CT may be warranted to further evaluate. 2. Continued further decrease in the 2.1 x 0.9 cm nodular lesion of the left upper lobe seen previously. Lesion is less confluent than before with areas of ground-glass opacity today. 3. Other bilateral pulmonary nodules are similar in the interval. 4. Aortic Atherosclerosis (ICD10-I70.0) and Emphysema (ICD10-J43.9).     Electronically Signed   By: Kennith Center M.D.   On: 08/02/2021 09:35 I personally reviewed the CT images and concur with the findings noted above.  Impression: Steven Salinas is a  61 year old man with a history of tobacco abuse, COPD, anxiety, hyperlipidemia, polyneuropathy, chronic neck and back pain, and waxing and waning lung nodules.  He quit smoking in 2017.  He has had waxing and waning lung nodules bilaterally.  Navigational bronchoscopy and biopsy showed necrotizing granulomas.  Cultures grew out MAI.  He currently is being treated for that with multiple antibiotics.  He is being followed by Dr. Luciana Salinas.  He has multiple other lung nodules.  I suspect these are all related to the same process.  However given his smoking history there is concern that he could have a lung cancer as well.  I do not think a PET will provide any additional information, but we will plan to repeat a CT in about 3 months.  Plan: Return in 3 months with CT chest  Loreli Slot, MD [facility] [phone]

## 2021-10-01 ENCOUNTER — Other Ambulatory Visit: Payer: Self-pay | Admitting: Internal Medicine

## 2021-10-01 DIAGNOSIS — A310 Pulmonary mycobacterial infection: Secondary | ICD-10-CM

## 2021-10-03 ENCOUNTER — Other Ambulatory Visit: Payer: Self-pay | Admitting: Thoracic Surgery (Cardiothoracic Vascular Surgery)

## 2021-10-03 DIAGNOSIS — R911 Solitary pulmonary nodule: Secondary | ICD-10-CM

## 2021-10-13 ENCOUNTER — Other Ambulatory Visit: Payer: Self-pay | Admitting: Internal Medicine

## 2021-10-27 ENCOUNTER — Ambulatory Visit: Payer: Medicare HMO | Admitting: Internal Medicine

## 2021-10-31 ENCOUNTER — Other Ambulatory Visit: Payer: Self-pay | Admitting: Internal Medicine

## 2021-10-31 DIAGNOSIS — A310 Pulmonary mycobacterial infection: Secondary | ICD-10-CM

## 2021-11-03 NOTE — Telephone Encounter (Signed)
Please advise if okay to refill. 

## 2021-11-08 ENCOUNTER — Other Ambulatory Visit: Payer: Self-pay | Admitting: Internal Medicine

## 2021-11-08 DIAGNOSIS — A310 Pulmonary mycobacterial infection: Secondary | ICD-10-CM

## 2021-11-15 ENCOUNTER — Encounter: Payer: Self-pay | Admitting: Thoracic Surgery (Cardiothoracic Vascular Surgery)

## 2021-11-15 ENCOUNTER — Ambulatory Visit
Admission: RE | Admit: 2021-11-15 | Discharge: 2021-11-15 | Disposition: A | Discharge status: Home / Self Care | Payer: Medicare HMO | Source: Ambulatory Visit | Attending: Thoracic Surgery (Cardiothoracic Vascular Surgery) | Admitting: Thoracic Surgery (Cardiothoracic Vascular Surgery)

## 2021-11-15 ENCOUNTER — Ambulatory Visit: Payer: Medicare HMO | Admitting: Internal Medicine

## 2021-11-15 ENCOUNTER — Ambulatory Visit (INDEPENDENT_AMBULATORY_CARE_PROVIDER_SITE_OTHER): Payer: Medicare HMO | Admitting: Thoracic Surgery (Cardiothoracic Vascular Surgery)

## 2021-11-15 VITALS — BP 156/77 | HR 78 | Resp 20 | Ht 70.0 in | Wt 166.0 lb

## 2021-11-15 DIAGNOSIS — R918 Other nonspecific abnormal finding of lung field: Secondary | ICD-10-CM

## 2021-11-15 DIAGNOSIS — R911 Solitary pulmonary nodule: Secondary | ICD-10-CM

## 2021-11-15 DIAGNOSIS — Z720 Tobacco use: Secondary | ICD-10-CM | POA: Diagnosis not present

## 2021-11-15 NOTE — Progress Notes (Signed)
? ?   ?[address] ?      [place] [address] ?            [phone]   ? ? ?HPI: [name] returns for follow-up of multiple lung nodules ? 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  He was found to have a spiculated lung nodule on a low-dose CT in February 2022.  By the time he had a PET/CT the nodule had slightly decreased in size but there was a new hypermetabolic nodule.  On follow-up CT that area increased in size.  Navigational bronchoscopy showed noncaseating granulomas and cultures grew out MAI.  He has been treated with ethambutol, azithromycin, and rifampin for about a year now. ? ?I last saw him in January.  His lung nodules were relatively stable at that time.  He now returns for 1-month follow-up. ? ?He has been feeling well.  He has been working.  He has an appointment with Dr. Luciana Salinas on Thursday.  No fevers, chills, sweats, wheezing or unusual cough. ? ?Past Medical History:  ?Diagnosis Date  ? COPD (chronic obstructive pulmonary disease) (HCC)   ? Generalized anxiety disorder   ? Hyperlipidemia   ? Insomnia   ? Pneumonia 2018  ? Polyneuropathy   ? ? ?Current Outpatient Medications  ?Medication Sig Dispense Refill  ? albuterol (VENTOLIN HFA) 108 (90 Base) MCG/ACT inhaler Inhale 2 puffs into the lungs every 6 (six) hours as needed for wheezing or shortness of breath.    ? ALPRAZolam (XANAX) 1 MG tablet Take 1 mg by mouth See admin instructions. Take 1 mg at bedtime, may take a second 1 mg dose during the day as needed for anxiety    ? ASPIRIN LOW DOSE 81 MG EC tablet Take 81 mg by mouth daily.    ? atorvastatin (LIPITOR) 10 MG tablet Take 10 mg by mouth daily.    ? azithromycin (ZITHROMAX) 500 MG tablet TAKE 1 TABLET BY MOUTH THREE TIMES A WEEK 12 tablet 3  ? buPROPion (WELLBUTRIN XL) 300 MG 24 hr tablet Take 300  mg by mouth daily.    ? cyclobenzaprine (FLEXERIL) 10 MG tablet Take 10 mg by mouth 3 (three) times daily as needed for muscle spasms.    ? ethambutol (MYAMBUTOL) 400 MG tablet TAKE 4 TABLETS BY MOUTH THREE TIMES A WEEK 42 tablet 0  ? gabapentin (NEURONTIN) 300 MG capsule Take 300 mg by mouth daily.    ? HYDROcodone-acetaminophen (NORCO) 10-325 MG tablet Take 1 tablet by mouth every 4 (four) hours as needed.    ? Naphazoline HCl (CLEAR EYES OP) Place 1 drop into both eyes daily as needed (irritation).    ? naproxen sodium (ALEVE) 220 MG tablet Take 220 mg by mouth daily as needed (headaches).    ? promethazine (PHENERGAN) 25 MG tablet Take 25 mg by mouth every 6 (six) hours as needed for nausea or vomiting.    ? rifampin (RIFADIN) 300 MG capsule TAKE TWO CAPSULES BY MOUTH THREE TIMES A WEEK 24 capsule 3  ? SYMBICORT 160-4.5 MCG/ACT inhaler Inhale 2 puffs into the lungs 2 (two) times daily.    ? ?No current facility-administered medications for this visit.  ? ? ?Physical Exam ?BP (!) 156/77 (BP Location: Left Arm, Patient Position: Sitting, Cuff Size: Normal)   Pulse 78   Resp  20   Ht 5\' 10"  (1.778 m)   Wt 166 lb (75.3 kg)   SpO2 95% Comment: RA  BMI 23.49 kg/m?  ?61 year old man in no acute distress ?Alert and oriented x3 with no focal deficits ?Well-developed well-nourished ?Lungs clear bilaterally ? ?Diagnostic Tests: ?CT CHEST WITHOUT CONTRAST ?  ?TECHNIQUE: ?Multidetector CT imaging of the chest was performed following the ?standard protocol without IV contrast. ?  ?RADIATION DOSE REDUCTION: This exam was performed according to the ?departmental dose-optimization program which includes automated ?exposure control, adjustment of the mA and/or kV according to ?patient size and/or use of iterative reconstruction technique. ?  ?COMPARISON:  08/02/2021 ?  ?FINDINGS: ?Cardiovascular: The heart size is normal. No substantial pericardial ?effusion. Coronary artery calcification is evident. Mild ?atherosclerotic  calcification is noted in the wall of the thoracic ?aorta. ?  ?Mediastinum/Nodes: No mediastinal lymphadenopathy. No evidence for ?gross hilar lymphadenopathy although assessment is limited by the ?lack of intravenous contrast on the current study. The esophagus has ?normal imaging features. There is no axillary lymphadenopathy. ?  ?Lungs/Pleura: Centrilobular and paraseptal emphysema evident. ?Biapical pleuroparenchymal scarring evident as before. ?  ?The irregular posterior right apical nodular density is unchanged, ?suggesting scar. ?  ?Index irregular elongated nodular lesion in the left upper lobe ?measured previously at 2.0 x 0.6 cm is 2.1 x 0.6 cm today, ?unchanged. ?  ?3 mm posterior right upper lobe nodule with adjacent ground-glass ?opacity measured on the prior study is stable today at 3 mm (78/8). ?  ?7 mm right upper lobe nodule measured previously is 7-8 mm today on ?83/8. ?  ?5 mm posterior left upper lobe nodule identified previously is 4 mm ?on image 55/8 today. ?  ?9 x 5 mm right middle lobe nodule on 01/07/8 was about 3 mm in size ?previously. ?  ?No other new suspicious pulmonary nodule or mass. No focal airspace ?consolidation. No pleural effusion. ?  ?Upper Abdomen: Unremarkable. ?  ?Musculoskeletal: No worrisome lytic or sclerotic osseous ?abnormality. ?  ?IMPRESSION: ?1. Irregular posterior right apical nodular density of concern on ?the previous study is stable in the interval, suggesting scar. ?2. Multiple bilateral pulmonary nodules identified previously are ?stable in the interval. There is a mildly progressive curvilinear ?nodule in the right middle lobe, indeterminate but likely benign. ?Continued surveillance could be used to ensure stability. ?3. Aortic Atherosclerosis (ICD10-I70.0) and Emphysema (ICD10-J43.9). ?  ?  ?Electronically Signed ?  By: 11-27-1993 M.D. ?  On: 11/15/2021 10:05 ?I personally reviewed the CT images.  For the most part of the lung nodules are stable.  There  is 1 nodule in the middle lobe that is larger but is nonsuspicious in appearance. ? 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 ? ?Lung nodules-overall stable.  No suspicious nodules.  Likely all secondary to MAI.  Has been on triple antibiotic therapy for about a year now.  He will follow-up with Dr. 2018 later this week.  I recommended we do another CT in about 6 months. ? ?Tobacco abuse-quit in 2017. ? ?COPD-stable symptomatically. ? ?Hypertension-blood pressure elevated.  Recommended follow-up with primary. ? ?Plan: ?Follow-up with Dr. 2018 as scheduled ?Return in 6 months with CT chest.  If finding are stable can probably go to annual low-dose screening again after that. ? ?Steven Axe, MD ?[facility] ?[phone]  [phone] ? ? ? ? ?

## 2021-11-17 ENCOUNTER — Ambulatory Visit: Payer: Medicare HMO | Admitting: Internal Medicine

## 2021-12-08 ENCOUNTER — Ambulatory Visit: Payer: Medicare HMO | Admitting: Internal Medicine

## 2021-12-15 ENCOUNTER — Ambulatory Visit: Payer: Medicare HMO | Admitting: Internal Medicine

## 2021-12-29 ENCOUNTER — Ambulatory Visit: Payer: Medicare HMO | Admitting: Internal Medicine

## 2022-01-04 ENCOUNTER — Telehealth: Payer: Self-pay

## 2022-01-04 ENCOUNTER — Other Ambulatory Visit: Payer: Self-pay | Admitting: Internal Medicine

## 2022-01-04 DIAGNOSIS — A310 Pulmonary mycobacterial infection: Secondary | ICD-10-CM

## 2022-01-04 NOTE — Telephone Encounter (Signed)
Called patient to schedule appointment, no answer.   Amazin Pincock D Stephone Gum, RN  

## 2022-01-04 NOTE — Telephone Encounter (Signed)
Okay to refill? Patient has canceled his last 6 appointments with you and I was unable to get in touch with him today to reschedule. Thanks!

## 2022-02-07 ENCOUNTER — Ambulatory Visit: Payer: Medicare HMO | Admitting: Internal Medicine

## 2022-02-17 ENCOUNTER — Other Ambulatory Visit: Payer: Self-pay | Admitting: Internal Medicine

## 2022-02-17 DIAGNOSIS — A310 Pulmonary mycobacterial infection: Secondary | ICD-10-CM

## 2022-02-17 NOTE — Telephone Encounter (Signed)
Called patient to schedule a follow up appointment, no answer. Please advise if okay to refill.

## 2022-02-20 ENCOUNTER — Other Ambulatory Visit: Payer: Self-pay | Admitting: Internal Medicine

## 2022-02-20 ENCOUNTER — Telehealth: Payer: Self-pay

## 2022-02-20 DIAGNOSIS — A310 Pulmonary mycobacterial infection: Secondary | ICD-10-CM

## 2022-02-20 NOTE — Telephone Encounter (Signed)
Attempted to call patient to schedule overdue appointment with office. Not able to reach him at this time. Third attempt office has made to schedule appointment.  Will forward message to provider to advise on refills.  Juanita Laster, RMA

## 2022-02-20 NOTE — Telephone Encounter (Signed)
Please advise on refills. Not able to reach patient to schedule appt.

## 2022-04-06 ENCOUNTER — Other Ambulatory Visit: Payer: Self-pay | Admitting: *Deleted

## 2022-04-06 DIAGNOSIS — R918 Other nonspecific abnormal finding of lung field: Secondary | ICD-10-CM

## 2022-04-13 IMAGING — CT CT CHEST W/O CM
2 of 5 series · 15 of 36 positions shown, 18 images · non-contrast
Comparison: 04/12/2021

CLINICAL DATA: Right apical lung nodule on previous imaging.

EXAM:
CT CHEST WITHOUT CONTRAST
TECHNIQUE: Multidetector CT imaging of the chest was performed following the
standard protocol without IV contrast.

[Series 4: chest 2.00 br40 s3 · coronal · 0.65mm/px · 3 of 161 slices shown]
[im 33/161  lung]
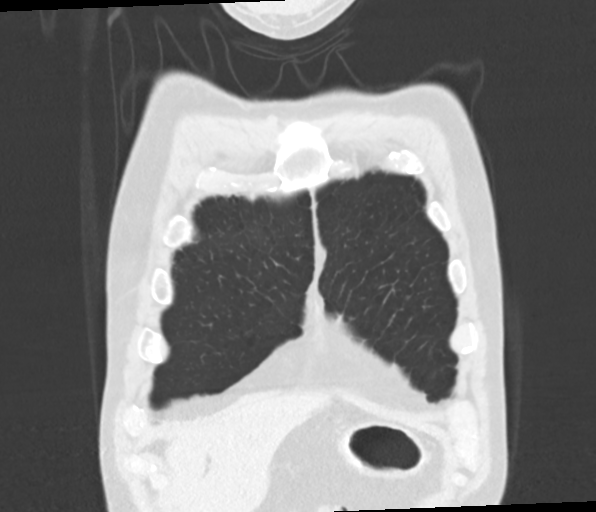
[im 65/161  lung]
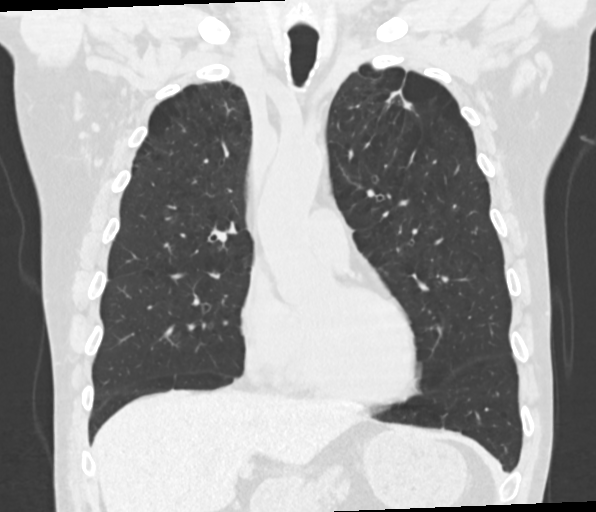
[im 97/161  lung]
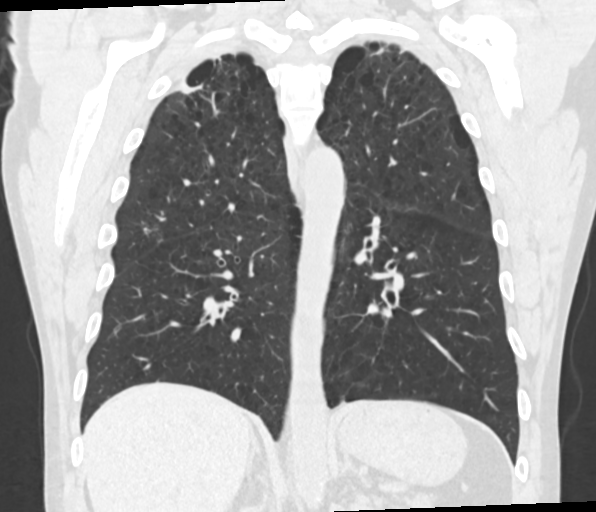

[Series 10: chest 1.00 br40 s3 super d · axial · 0.76mm/px · z∈[+1475,+1763]mm · 12 of 416 slices shown, 15 images]
[im 28/416  mediastinal]
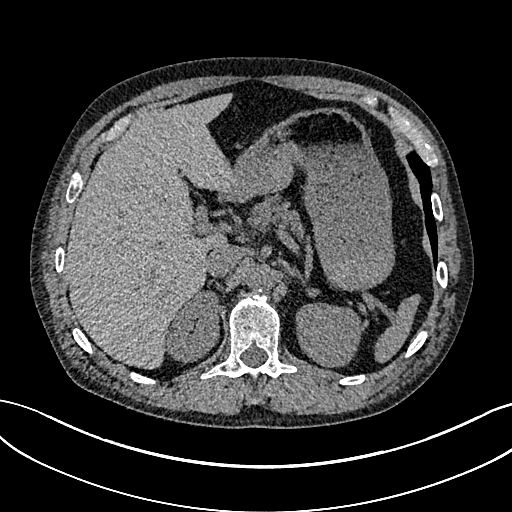
[im 28/416  lung]
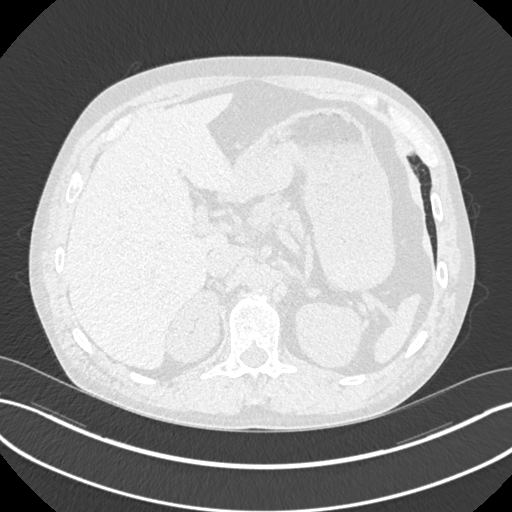
[im 56/416  lung]
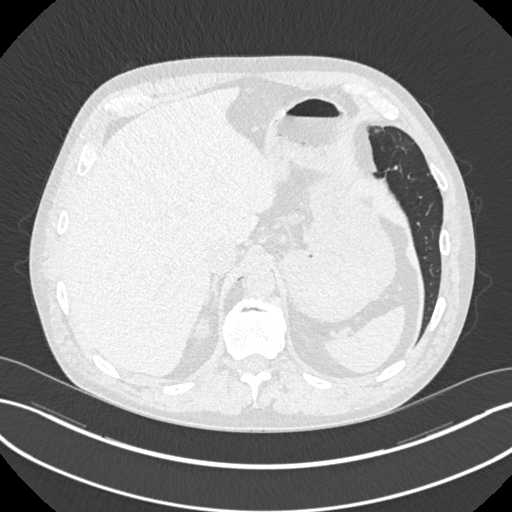
[im 84/416  lung]
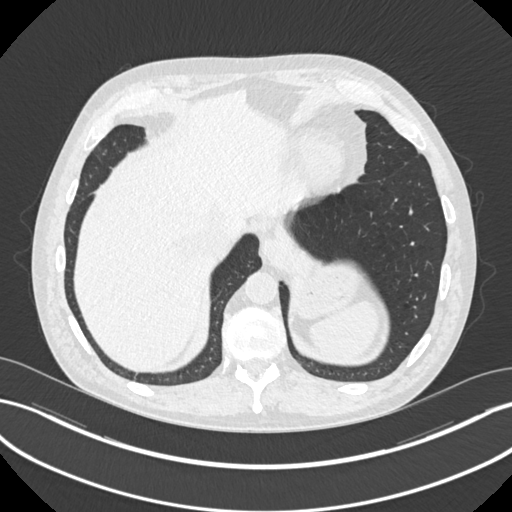
[im 139/416  lung]
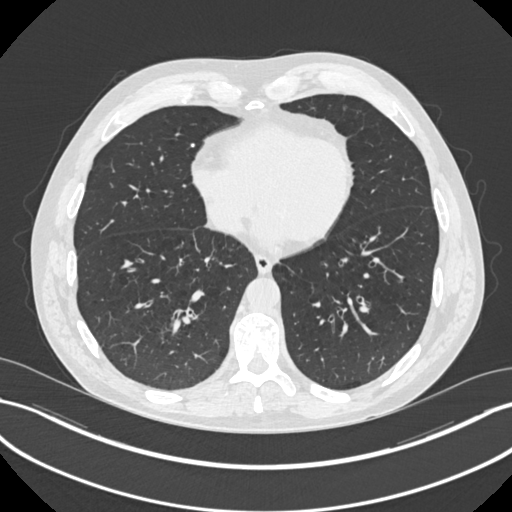
[im 167/416  mediastinal]
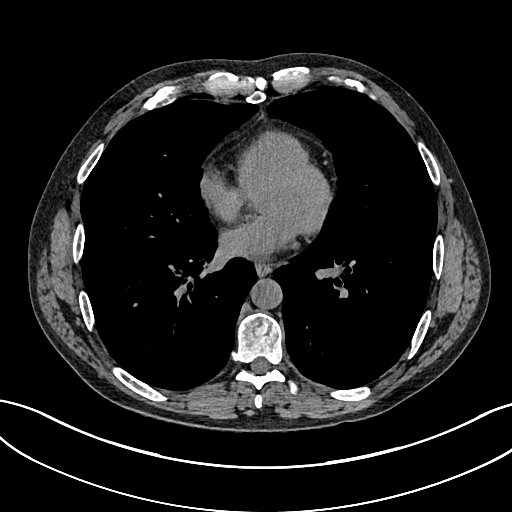
[im 167/416  lung]
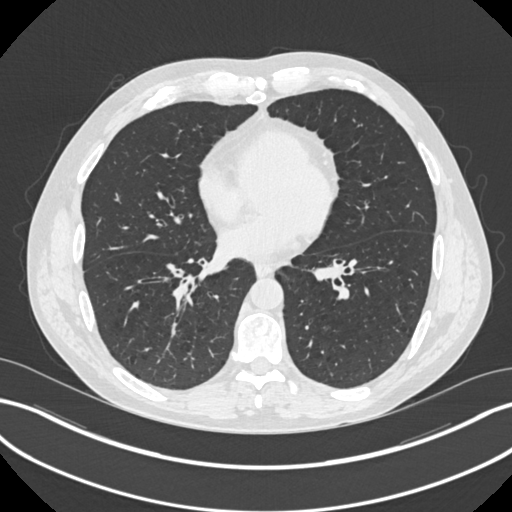
[im 194/416  lung]
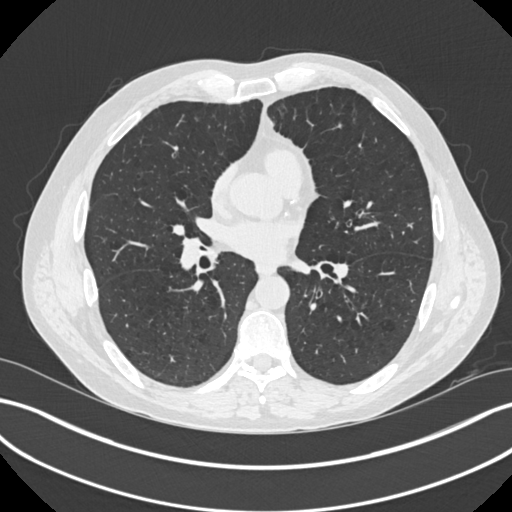
[im 222/416  lung]
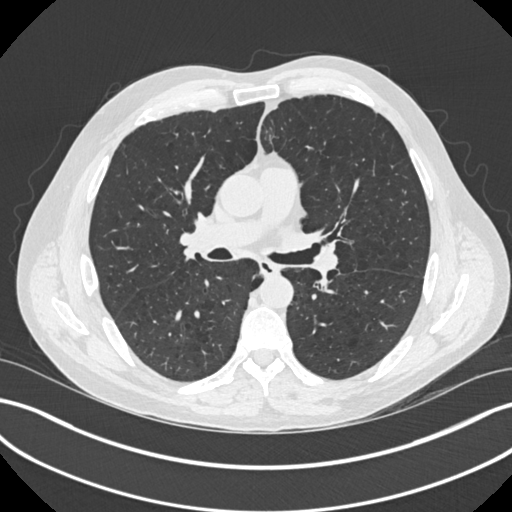
[im 250/416  lung]
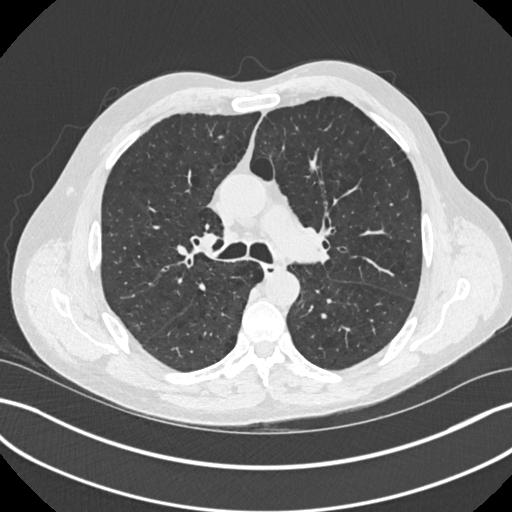
[im 277/416  mediastinal]
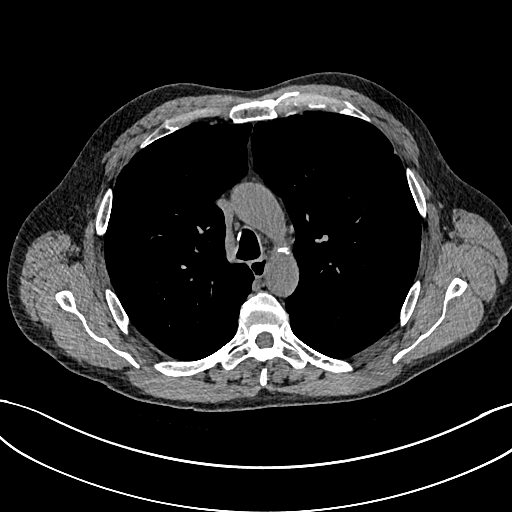
[im 277/416  lung]
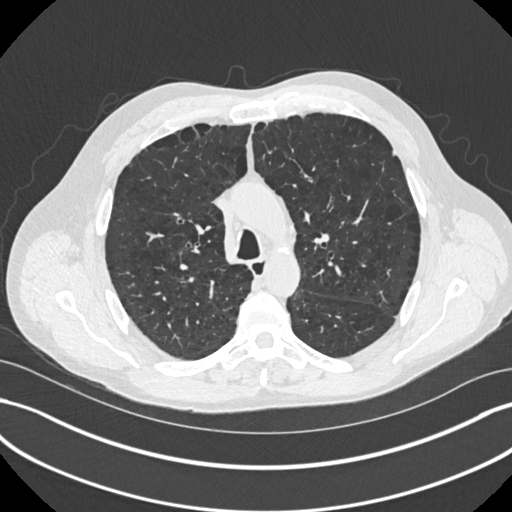
[im 333/416  lung]
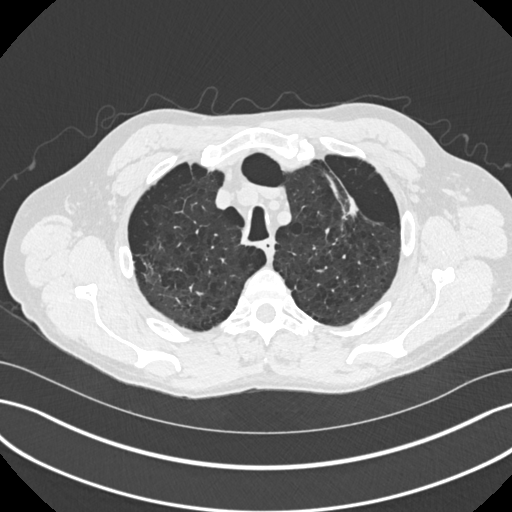
[im 360/416  lung]
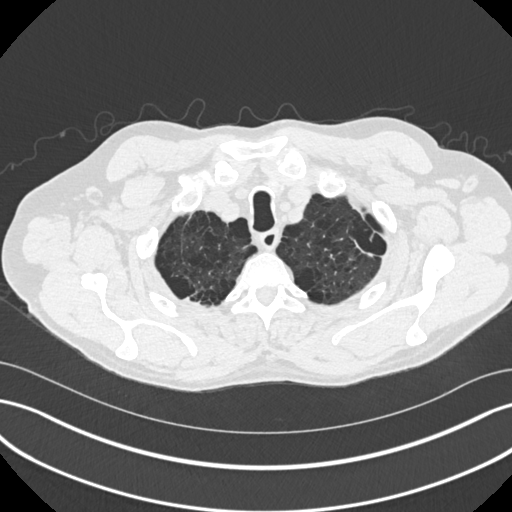
[im 388/416  lung]
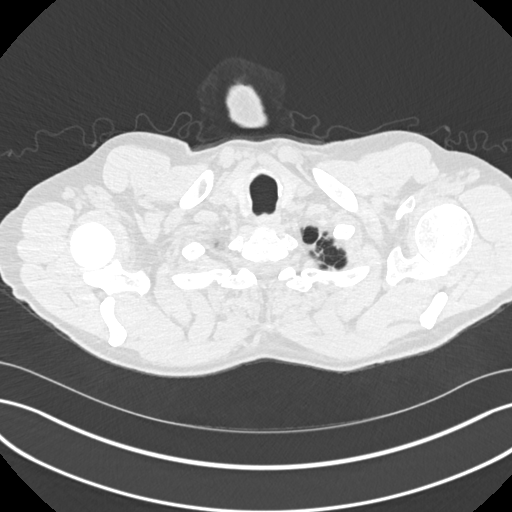

[15 of 36 positions shown; findings below may reference images not displayed]

FINDINGS: Cardiovascular: The heart size is normal. No substantial pericardial
effusion. Coronary artery calcification is evident. Mild
atherosclerotic calcification is noted in the wall of the thoracic
aorta.

Mediastinum/Nodes: No mediastinal lymphadenopathy. No evidence for
gross hilar lymphadenopathy although assessment is limited by the
lack of intravenous contrast on the current study. The esophagus has
normal imaging features. There is no axillary lymphadenopathy.

Lungs/Pleura: Centrilobular and paraseptal emphysema evident.

Interval evolution of the irregular posterior right apical nodular
density with interval development of patchy irregular peripheral
consolidative opacity inferior to the lesion today creating a more
irregular plaque-like appearance.

2.1 x 0.9 cm nodular lesion in the left upper lobe seen previously
is less confluent with areas of ground-glass opacity today,
measuring 2.0 x 0.6 cm.

3-4 mm posterior right upper lobe nodule with a suggestion of
adjacent ground-glass opacity (76/8) is stable.

7 mm right upper lobe nodule on 82/8 is unchanged.

5 mm posterior left upper lobe nodule on 51/8 is stable.

No new areas of focal airspace consolidation. No pulmonary edema or
pleural effusion.

Upper Abdomen: Tiny exophytic lesion upper pole left kidney is too
small to characterize but similar to prior likely benign.

Musculoskeletal: No worrisome lytic or sclerotic osseous
abnormality.
IMPRESSION: 1. Interval evolution of the irregular posterior right apical
nodular density with interval development of patchy irregular
peripheral consolidative opacity inferior to the lesion today,
creating an irregular plaque-like appearance. Imaging features
suggest infectious/inflammatory etiology although neoplasm cannot be
excluded. Continued close attention recommended and PET-CT may be
warranted to further evaluate.
2. Continued further decrease in the 2.1 x 0.9 cm nodular lesion of
the left upper lobe seen previously. Lesion is less confluent than
before with areas of ground-glass opacity today.
3. Other bilateral pulmonary nodules are similar in the interval.
4. Aortic Atherosclerosis (LZ8HT-DQ3.3) and Emphysema (LZ8HT-STD.I).

## 2022-05-23 ENCOUNTER — Encounter: Payer: Medicare HMO | Admitting: Thoracic Surgery (Cardiothoracic Vascular Surgery)

## 2022-05-23 ENCOUNTER — Other Ambulatory Visit: Payer: Medicare HMO

## 2022-06-13 ENCOUNTER — Encounter: Payer: Medicare HMO | Admitting: Thoracic Surgery (Cardiothoracic Vascular Surgery)

## 2022-06-28 ENCOUNTER — Other Ambulatory Visit: Payer: Medicare HMO

## 2022-07-04 ENCOUNTER — Encounter: Payer: Medicare HMO | Admitting: Thoracic Surgery (Cardiothoracic Vascular Surgery)

## 2022-07-25 ENCOUNTER — Encounter (INDEPENDENT_AMBULATORY_CARE_PROVIDER_SITE_OTHER): Payer: Medicare HMO | Admitting: Internal Medicine

## 2022-07-25 NOTE — Progress Notes (Signed)
Erroneous encounter - please disregard.

## 2022-07-27 IMAGING — CT CT CHEST W/O CM
2 of 5 series · 15 of 36 positions shown, 18 images · non-contrast
Comparison: 08/02/2021

CLINICAL DATA: Pulmonary nodules.



[Series 4: chest 2.00 br40 s3 · coronal · 0.67mm/px · 3 of 160 slices shown]
[im 32/160  lung]
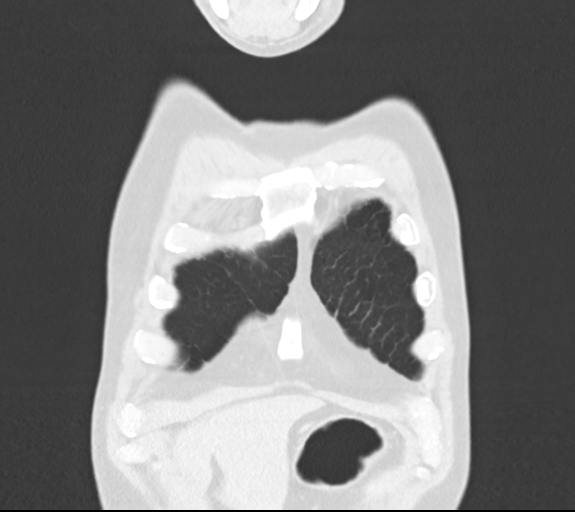
[im 64/160  lung]
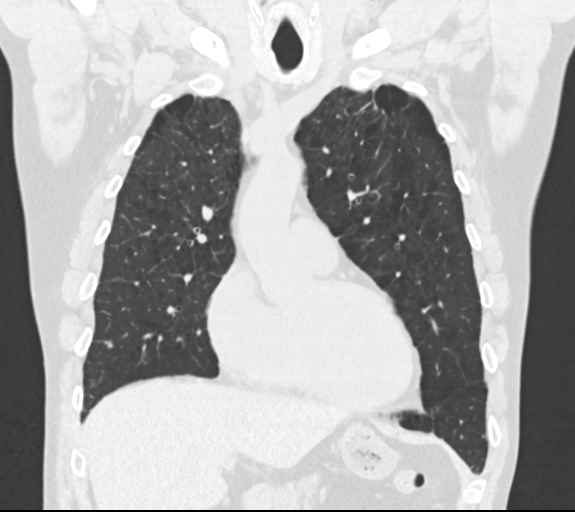
[im 96/160  lung]
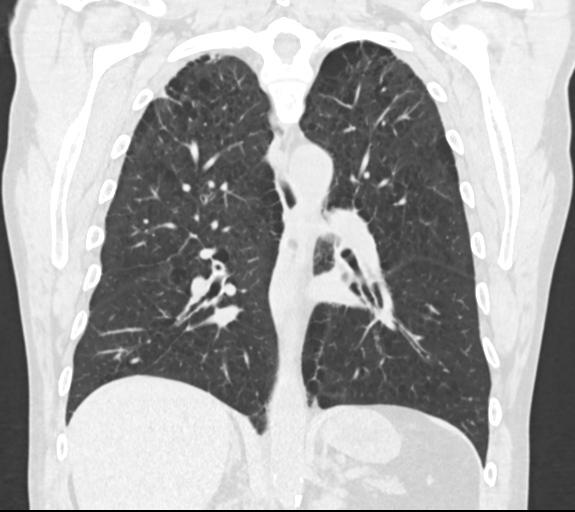

[Series 10: chest 1.00 br40 s3 super d · axial · 0.74mm/px · z∈[+1491,+1787]mm · 12 of 428 slices shown, 15 images]
[im 29/428  mediastinal]
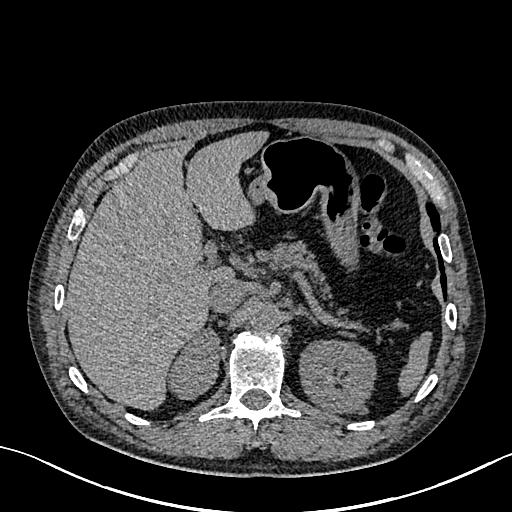
[im 29/428  lung]
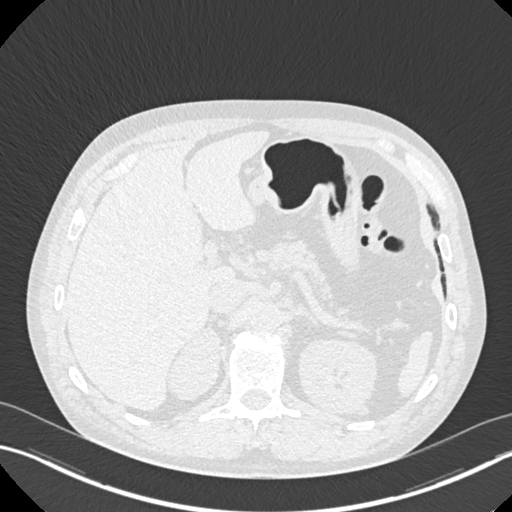
[im 57/428  lung]
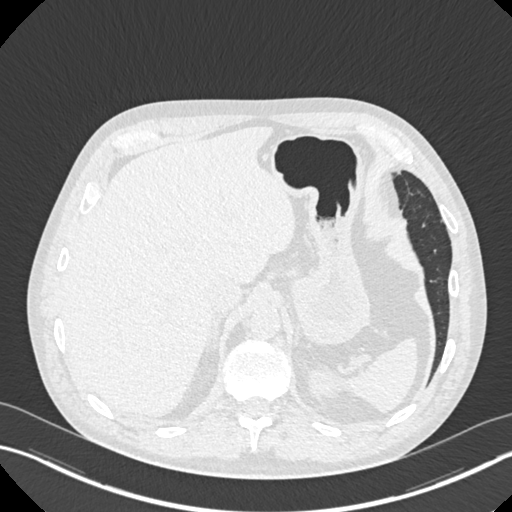
[im 86/428  lung]
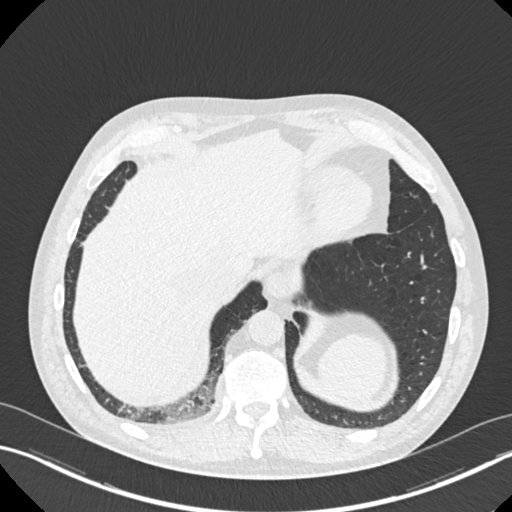
[im 143/428  lung]
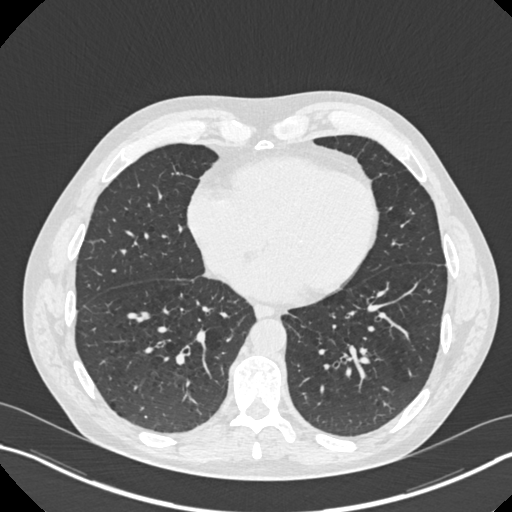
[im 171/428  mediastinal]
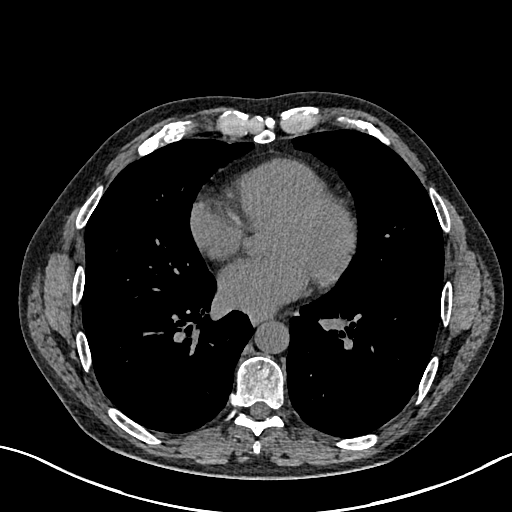
[im 171/428  lung]
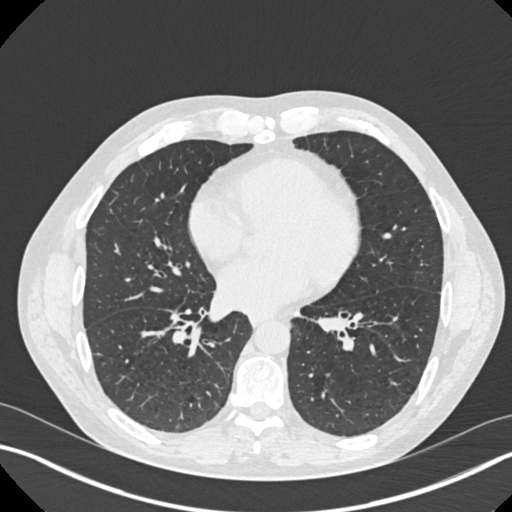
[im 200/428  lung]
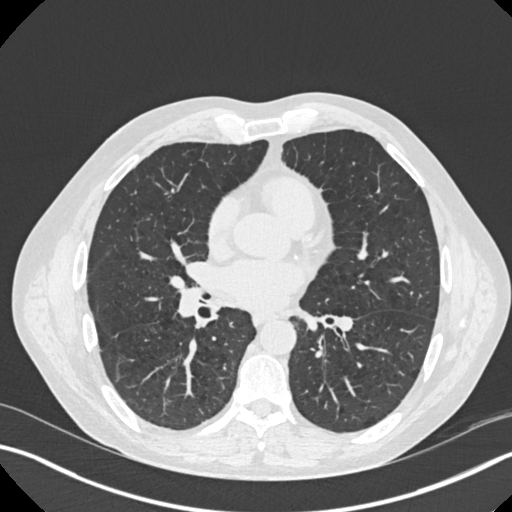
[im 228/428  lung]
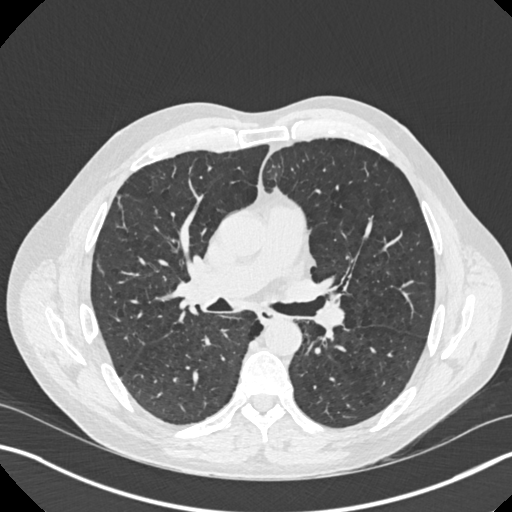
[im 257/428  lung]
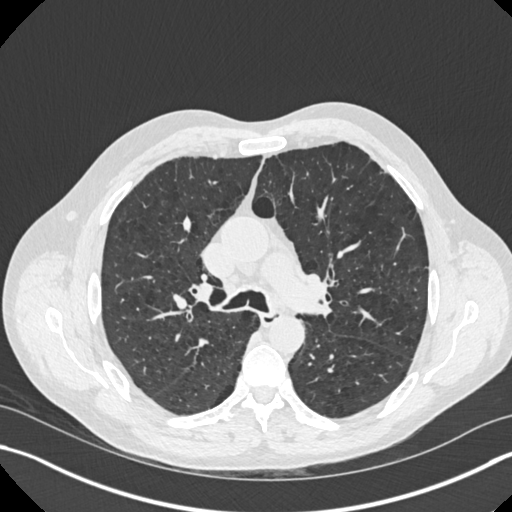
[im 285/428  mediastinal]
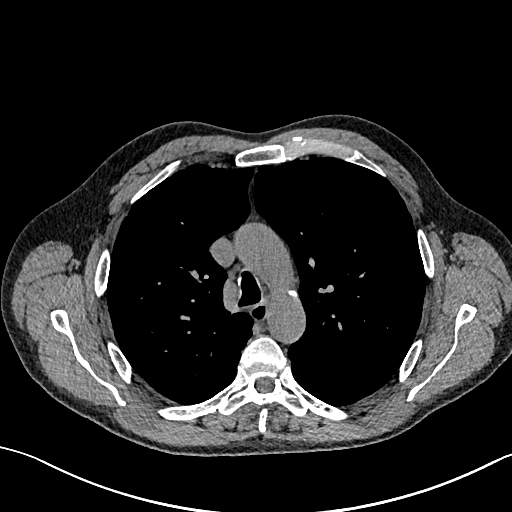
[im 285/428  lung]
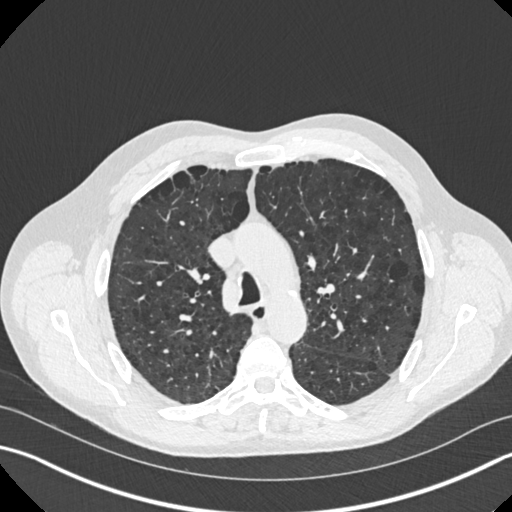
[im 342/428  lung]
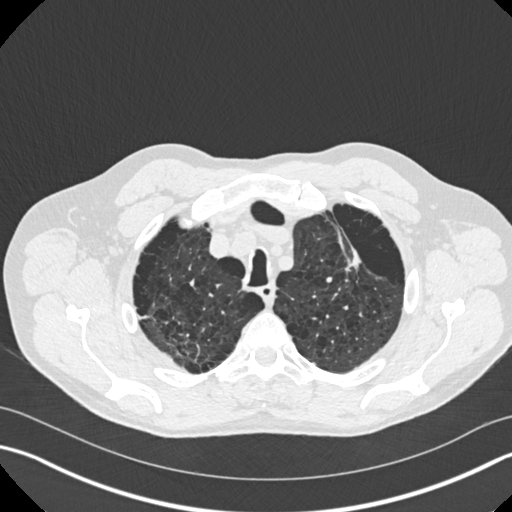
[im 371/428  lung]
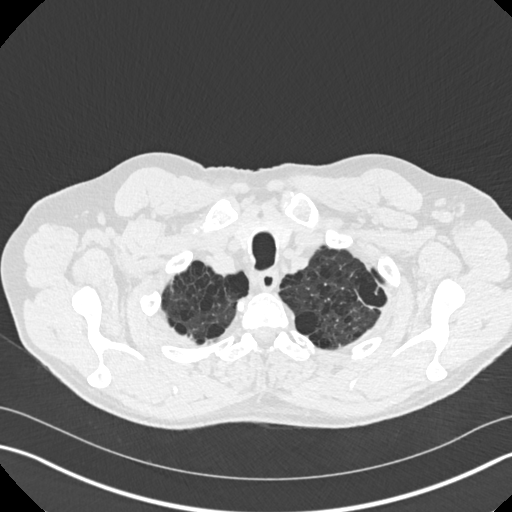
[im 399/428  lung]
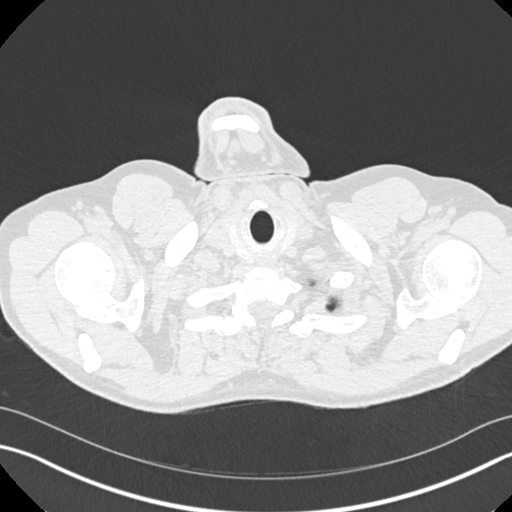

[15 of 36 positions shown; findings below may reference images not displayed]

FINDINGS: Cardiovascular: The heart size is normal. No substantial pericardial
effusion. Coronary artery calcification is evident. Mild
atherosclerotic calcification is noted in the wall of the thoracic
aorta.

Mediastinum/Nodes: No mediastinal lymphadenopathy. No evidence for
gross hilar lymphadenopathy although assessment is limited by the
lack of intravenous contrast on the current study. The esophagus has
normal imaging features. There is no axillary lymphadenopathy.

Lungs/Pleura: Centrilobular and paraseptal emphysema evident.
Biapical pleuroparenchymal scarring evident as before.

The irregular posterior right apical nodular density is unchanged,
suggesting scar.

Index irregular elongated nodular lesion in the left upper lobe
measured previously at 2.0 x 0.6 cm is 2.1 x 0.6 cm today,
unchanged.

3 mm posterior right upper lobe nodule with adjacent ground-glass
opacity measured on the prior study is stable today at 3 mm (78/8).

7 mm right upper lobe nodule measured previously is 7-8 mm today on
83/8.

5 mm posterior left upper lobe nodule identified previously is 4 mm
on image 55/8 today.

9 x 5 mm right middle lobe nodule on 00/02/05 was about 3 mm in size
previously.

No other new suspicious pulmonary nodule or mass. No focal airspace
consolidation. No pleural effusion.

Upper Abdomen: Unremarkable.

Musculoskeletal: No worrisome lytic or sclerotic osseous
abnormality.
IMPRESSION: 1. Irregular posterior right apical nodular density of concern on
the previous study is stable in the interval, suggesting scar.
2. Multiple bilateral pulmonary nodules identified previously are
stable in the interval. There is a mildly progressive curvilinear
nodule in the right middle lobe, indeterminate but likely benign.
Continued surveillance could be used to ensure stability.
3. Aortic Atherosclerosis (QOTHH-KB1.1) and Emphysema (QOTHH-YBR.G).

## 2023-04-24 ENCOUNTER — Encounter: Payer: Self-pay | Admitting: Internal Medicine

## 2023-05-07 ENCOUNTER — Ambulatory Visit: Payer: Medicare HMO | Admitting: Gastroenterology
